# Patient Record
Sex: Female | Born: 1945 | Race: White | Hispanic: No | Marital: Married | Smoking: Former smoker
Health system: Southern US, Community
[De-identification: ages and names within clinical notes are randomized; demographics above are authoritative.]

## PROBLEM LIST (undated history)

## (undated) DIAGNOSIS — K649 Unspecified hemorrhoids: Secondary | ICD-10-CM

## (undated) DIAGNOSIS — J302 Other seasonal allergic rhinitis: Secondary | ICD-10-CM

## (undated) DIAGNOSIS — K219 Gastro-esophageal reflux disease without esophagitis: Secondary | ICD-10-CM

## (undated) DIAGNOSIS — R42 Dizziness and giddiness: Secondary | ICD-10-CM

## (undated) DIAGNOSIS — L309 Dermatitis, unspecified: Secondary | ICD-10-CM

## (undated) DIAGNOSIS — J189 Pneumonia, unspecified organism: Secondary | ICD-10-CM

## (undated) DIAGNOSIS — M1990 Unspecified osteoarthritis, unspecified site: Secondary | ICD-10-CM

## (undated) DIAGNOSIS — I10 Essential (primary) hypertension: Secondary | ICD-10-CM

## (undated) DIAGNOSIS — Z8673 Personal history of transient ischemic attack (TIA), and cerebral infarction without residual deficits: Secondary | ICD-10-CM

## (undated) DIAGNOSIS — R21 Rash and other nonspecific skin eruption: Secondary | ICD-10-CM

## (undated) HISTORY — PX: HEMORROIDECTOMY: SUR656

## (undated) HISTORY — PX: ABDOMINAL HYSTERECTOMY: SHX81

## (undated) HISTORY — PX: TUBAL LIGATION: SHX77

---

## 2004-02-01 ENCOUNTER — Ambulatory Visit: Payer: Self-pay | Admitting: Family Medicine

## 2004-07-14 ENCOUNTER — Ambulatory Visit: Payer: Self-pay | Admitting: Family Medicine

## 2004-09-12 ENCOUNTER — Ambulatory Visit: Payer: Self-pay | Admitting: Family Medicine

## 2004-12-12 ENCOUNTER — Ambulatory Visit: Payer: Self-pay | Admitting: Family Medicine

## 2005-01-09 ENCOUNTER — Ambulatory Visit: Payer: Self-pay | Admitting: Family Medicine

## 2005-04-18 ENCOUNTER — Ambulatory Visit: Payer: Self-pay | Admitting: Family Medicine

## 2005-08-21 ENCOUNTER — Ambulatory Visit: Payer: Self-pay | Admitting: Family Medicine

## 2005-12-26 ENCOUNTER — Ambulatory Visit: Payer: Self-pay | Admitting: Family Medicine

## 2006-02-08 ENCOUNTER — Ambulatory Visit: Payer: Self-pay | Admitting: Family Medicine

## 2006-07-04 ENCOUNTER — Ambulatory Visit: Payer: Self-pay | Admitting: Family Medicine

## 2011-09-15 ENCOUNTER — Encounter (HOSPITAL_COMMUNITY): Payer: Self-pay | Admitting: Pharmacy Technician

## 2011-09-20 ENCOUNTER — Ambulatory Visit (HOSPITAL_COMMUNITY)
Admission: RE | Admit: 2011-09-20 | Discharge: 2011-09-20 | Disposition: A | Discharge status: Home / Self Care | Payer: Medicare Other | Source: Ambulatory Visit | Attending: Orthopedic Surgery | Admitting: Orthopedic Surgery

## 2011-09-20 ENCOUNTER — Encounter (HOSPITAL_COMMUNITY): Payer: Self-pay

## 2011-09-20 ENCOUNTER — Encounter (HOSPITAL_COMMUNITY)
Admission: RE | Admit: 2011-09-20 | Discharge: 2011-09-20 | Disposition: A | Discharge status: Home / Self Care | Payer: Medicare Other | Source: Ambulatory Visit | Attending: Orthopedic Surgery | Admitting: Orthopedic Surgery

## 2011-09-20 DIAGNOSIS — Z01810 Encounter for preprocedural cardiovascular examination: Secondary | ICD-10-CM | POA: Insufficient documentation

## 2011-09-20 DIAGNOSIS — R9431 Abnormal electrocardiogram [ECG] [EKG]: Secondary | ICD-10-CM | POA: Insufficient documentation

## 2011-09-20 DIAGNOSIS — Z01812 Encounter for preprocedural laboratory examination: Secondary | ICD-10-CM | POA: Insufficient documentation

## 2011-09-20 DIAGNOSIS — Z01818 Encounter for other preprocedural examination: Secondary | ICD-10-CM | POA: Insufficient documentation

## 2011-09-20 DIAGNOSIS — Z87891 Personal history of nicotine dependence: Secondary | ICD-10-CM | POA: Insufficient documentation

## 2011-09-20 HISTORY — DX: Gastro-esophageal reflux disease without esophagitis: K21.9

## 2011-09-20 HISTORY — DX: Essential (primary) hypertension: I10

## 2011-09-20 HISTORY — DX: Other seasonal allergic rhinitis: J30.2

## 2011-09-20 HISTORY — DX: Unspecified osteoarthritis, unspecified site: M19.90

## 2011-09-20 LAB — URINALYSIS, ROUTINE W REFLEX MICROSCOPIC
Ketones, ur: NEGATIVE mg/dL
Leukocytes, UA: NEGATIVE
Nitrite: NEGATIVE
pH: 6 (ref 5.0–8.0)

## 2011-09-20 LAB — PROTIME-INR
INR: 0.94 (ref 0.00–1.49)
Prothrombin Time: 12.8 seconds (ref 11.6–15.2)

## 2011-09-20 LAB — CBC
MCH: 30.6 pg (ref 26.0–34.0)
MCHC: 33 g/dL (ref 30.0–36.0)
Platelets: 322 10*3/uL (ref 150–400)
RDW: 14.2 % (ref 11.5–15.5)

## 2011-09-20 LAB — BASIC METABOLIC PANEL
CO2: 28 mEq/L (ref 19–32)
Chloride: 95 mEq/L — ABNORMAL LOW (ref 96–112)
GFR calc Af Amer: 74 mL/min — ABNORMAL LOW (ref >90)
Sodium: 132 mEq/L — ABNORMAL LOW (ref 135–145)

## 2011-09-20 LAB — APTT: aPTT: 29 seconds (ref 24–37)

## 2011-09-20 NOTE — Patient Instructions (Signed)
Kristy Watson  09/20/2011   Your procedure is scheduled on:  09-20-2011  Report to [facility] at  0630 AM.  Call this number if you have problems the morning of surgery: [phone]   Remember:   Do not eat food or drink liquids:After Midnight.  .  Take these medicines the morning of surgery with A SIP OF WATER: albuterol inhaler if needed, flonase nasal spray if needed, advair if needed, ativan if needed   Do not wear jewelry or make up.  Do not wear lotions, powders, or perfumes.Do not wear deodorant.    Do not bring valuables to the hospital.  Contacts, dentures or bridgework may not be worn into surgery.  Leave suitcase in the car. After surgery it may be brought to your room.  For patients admitted to the hospital, checkout time is 11:00 AM the day    discharge                             Special Instructions: CHG Shower Use Special Wash: 1/2 bottle night before surgery and 1/2 bottle morning of surgery, use regular soap on face and front and back private area. No shaving for 2 days before surgery.   Please read over the following fact sheets that you were given: MRSA Information, blood fact sheet, incentive spirometer fact sheet  [facility] pre op nurse phone number [phone], call if needed

## 2011-09-21 NOTE — Pre-Procedure Instructions (Signed)
Spoke with dr fortune made aware 08-20-2011 ekg results, pt ok for surgery

## 2011-09-25 NOTE — H&P (Signed)
Kristy Watson is an 66 y.o. female.    Chief Complaint:   Right hip OA and pain   HPI: Pt is a 66 y.o. female complaining of right hip pain for 1+ years. Pain had continually increased since the beginning, and has been effecting her life more and more. X-rays in the clinic show end-stage arthritic changes of the right hip. Pt has tried various conservative treatments which have failed to alleviate their symptoms, NSAIDs. Various options are discussed with the patient. Risks, benefits and expectations were discussed with the patient. Patient understand the risks, benefits and expectations and wishes to proceed with surgery. Dr. Lysbeth Watson has cleared the patient for surgery.  PCP:  Kristy Hector, MD  D/C Plans:  Home with HHPT  Post-op Meds:  Rx given for ASA, Robaxin, Iron, Colace and MiraLax  Tranexamic Acid:   To be given  Decadron:   To be given  PMH: Past Medical History  Diagnosis Date  . Hypertension   . Seasonal allergies   . GERD (gastroesophageal reflux disease)   . Arthritis     PSH: Past Surgical History  Procedure Date  . Abdominal hysterectomy 34 yrs ago  . Hemorroidectomy yrs ago  . Tubal ligation yrs ago    Social History:  reports that she quit smoking about 12 years ago. Her smoking use included Cigarettes. She has a 5 pack-year smoking history. She has never used smokeless tobacco. She reports that she drinks about 1.8 ounces of alcohol per week. She reports that she does not use illicit drugs.  Allergies:  No Known Allergies  Medications: No current facility-administered medications for this encounter.   Current Outpatient Prescriptions  Medication Sig Dispense Refill  . acetaminophen (TYLENOL) 500 MG tablet Take 1,000 mg by mouth every 6 (six) hours as needed.      Marland Kitchen albuterol (PROVENTIL HFA;VENTOLIN HFA) 108 (90 BASE) MCG/ACT inhaler Inhale 2 puffs into the lungs every 6 (six) hours as needed. Wheezing      . amLODipine-benazepril (LOTREL) 5-20  MG per capsule Take 1 capsule by mouth daily before breakfast.      . cholecalciferol (VITAMIN D) 1000 UNITS tablet Take 1,000 Units by mouth daily.      . cyclobenzaprine (FLEXERIL) 5 MG tablet Take 5-10 mg by mouth 2 (two) times daily as needed. Muscle spasm      . esomeprazole (NEXIUM) 20 MG capsule Take 20 mg by mouth every evening.      . estrogens, conjugated, (PREMARIN) 0.625 MG tablet Take 0.625 mg by mouth at bedtime. Take daily for 21 days then do not take for 7 days.      . fish oil-omega-3 fatty acids 1000 MG capsule Take 1 g by mouth 2 (two) times daily. 2 in am, 1 in pm      . fluticasone (FLONASE) 50 MCG/ACT nasal spray Place 2 sprays into the nose daily as needed. Allergies      . Fluticasone-Salmeterol (ADVAIR) 100-50 MCG/DOSE AEPB Inhale 1 puff into the lungs 2 (two) times daily as needed. Wheezing      . LORazepam (ATIVAN) 0.5 MG tablet Take 0.5 mg by mouth every 8 (eight) hours as needed. Anxiety      . Naproxen-Esomeprazole (VIMOVO) 500-20 MG TBEC Take 1 tablet by mouth 2 (two) times daily.        ROS: Review of Systems  Constitutional: Negative.   HENT: Positive for tinnitus.   Eyes: Negative.   Respiratory: Negative.   Cardiovascular: Negative.  Gastrointestinal: Positive for heartburn.  Genitourinary: Negative.   Musculoskeletal: Positive for back pain and joint pain.  Skin: Negative.   Neurological: Negative.   Endo/Heme/Allergies: Negative.   Psychiatric/Behavioral: Negative.      Physical Exam: Physical Exam  Constitutional: She is oriented to person, place, and time and well-developed, well-nourished, and in no distress.  HENT:  Head: Normocephalic and atraumatic.  Nose: Nose normal.  Mouth/Throat: Oropharynx is clear and moist.  Eyes: Pupils are equal, round, and reactive to light.  Neck: Neck supple. No JVD present. No tracheal deviation present. No thyromegaly present.  Cardiovascular: Normal rate, regular rhythm and intact distal pulses.     Pulmonary/Chest: Effort normal and breath sounds normal. No respiratory distress. She has no wheezes. She has no rales. She exhibits no tenderness.  Abdominal: Soft. There is no tenderness. There is no guarding.  Musculoskeletal:       Right hip: She exhibits decreased range of motion, decreased strength, tenderness, bony tenderness and crepitus. She exhibits no swelling, no deformity and no laceration.  Lymphadenopathy:    She has no cervical adenopathy.  Neurological: She is alert and oriented to person, place, and time.  Skin: Skin is warm and dry.  Psychiatric: Affect normal.     Assessment/Plan Assessment:  Right hip OA and pain   Plan: Patient will undergo a right total hip arthroplasty, anterior approach on 09/26/2011 per Dr. Charlann Watson at [facility]. Risks benefits and expectation were discussed with the patient. Patient understand risks, benefits and expectation and wishes to proceed.   [name]   PAC  09/25/2011, 8:16 PM

## 2011-09-26 ENCOUNTER — Encounter (HOSPITAL_COMMUNITY): Payer: Self-pay | Admitting: *Deleted

## 2011-09-26 ENCOUNTER — Inpatient Hospital Stay (HOSPITAL_COMMUNITY): Payer: Medicare Other | Admitting: Anesthesiology

## 2011-09-26 ENCOUNTER — Inpatient Hospital Stay (HOSPITAL_COMMUNITY)
Admission: RE | Admit: 2011-09-26 | Discharge: 2011-09-28 | DRG: 470 | Disposition: A | Discharge status: Home Health | Payer: Medicare Other | Source: Ambulatory Visit | Attending: Orthopedic Surgery | Admitting: Orthopedic Surgery

## 2011-09-26 ENCOUNTER — Encounter (HOSPITAL_COMMUNITY): Payer: Self-pay | Admitting: Anesthesiology

## 2011-09-26 ENCOUNTER — Inpatient Hospital Stay (HOSPITAL_COMMUNITY): Payer: Medicare Other

## 2011-09-26 ENCOUNTER — Encounter (HOSPITAL_COMMUNITY)
Admission: RE | Disposition: A | Discharge status: Home Health | Payer: Self-pay | Source: Ambulatory Visit | Attending: Orthopedic Surgery

## 2011-09-26 DIAGNOSIS — Z79899 Other long term (current) drug therapy: Secondary | ICD-10-CM

## 2011-09-26 DIAGNOSIS — Z96649 Presence of unspecified artificial hip joint: Secondary | ICD-10-CM

## 2011-09-26 DIAGNOSIS — I10 Essential (primary) hypertension: Secondary | ICD-10-CM | POA: Diagnosis present

## 2011-09-26 DIAGNOSIS — D62 Acute posthemorrhagic anemia: Secondary | ICD-10-CM | POA: Diagnosis not present

## 2011-09-26 DIAGNOSIS — M1610 Unilateral primary osteoarthritis, unspecified hip: Principal | ICD-10-CM | POA: Diagnosis present

## 2011-09-26 DIAGNOSIS — Z87891 Personal history of nicotine dependence: Secondary | ICD-10-CM

## 2011-09-26 DIAGNOSIS — M169 Osteoarthritis of hip, unspecified: Principal | ICD-10-CM | POA: Diagnosis present

## 2011-09-26 DIAGNOSIS — K219 Gastro-esophageal reflux disease without esophagitis: Secondary | ICD-10-CM | POA: Diagnosis present

## 2011-09-26 DIAGNOSIS — IMO0002 Reserved for concepts with insufficient information to code with codable children: Secondary | ICD-10-CM | POA: Diagnosis present

## 2011-09-26 HISTORY — PX: TOTAL HIP ARTHROPLASTY: SHX124

## 2011-09-26 LAB — TYPE AND SCREEN: ABO/RH(D): A POS

## 2011-09-26 SURGERY — ARTHROPLASTY, HIP, TOTAL, ANTERIOR APPROACH
Anesthesia: General | Site: Hip | Laterality: Right | Wound class: Clean

## 2011-09-26 MED ORDER — METOCLOPRAMIDE HCL 10 MG PO TABS: 5.0000 mg | ORAL_TABLET | Freq: Three times a day (TID) | ORAL | Status: DC | PRN

## 2011-09-26 MED ORDER — POLYETHYLENE GLYCOL 3350 17 G PO PACK
17.0000 g | PACK | Freq: Two times a day (BID) | ORAL | Status: DC
  Administered 2011-09-26 – 2011-09-27 (×2): 17 g via ORAL

## 2011-09-26 MED ORDER — FLUTICASONE-SALMETEROL 100-50 MCG/DOSE IN AEPB: 1.0000 | INHALATION_SPRAY | Freq: Two times a day (BID) | RESPIRATORY_TRACT | Status: DC | PRN

## 2011-09-26 MED ORDER — FERROUS SULFATE 325 (65 FE) MG PO TABS
325.0000 mg | ORAL_TABLET | Freq: Three times a day (TID) | ORAL | Status: DC
  Administered 2011-09-26 – 2011-09-28 (×3): 325 mg via ORAL
  Filled 2011-09-26 (×8): qty 1

## 2011-09-26 MED ORDER — PANTOPRAZOLE SODIUM 40 MG PO TBEC
40.0000 mg | DELAYED_RELEASE_TABLET | Freq: Every day | ORAL | Status: DC
  Administered 2011-09-26 – 2011-09-28 (×3): 40 mg via ORAL
  Filled 2011-09-26 (×3): qty 1

## 2011-09-26 MED ORDER — CEFAZOLIN SODIUM-DEXTROSE 2-3 GM-% IV SOLR
2.0000 g | INTRAVENOUS | Status: AC
  Administered 2011-09-26: 2 g via INTRAVENOUS

## 2011-09-26 MED ORDER — ALBUTEROL SULFATE HFA 108 (90 BASE) MCG/ACT IN AERS: 2.0000 | INHALATION_SPRAY | Freq: Four times a day (QID) | RESPIRATORY_TRACT | Status: DC | PRN

## 2011-09-26 MED ORDER — SODIUM CHLORIDE 0.9 % IV SOLN
100.0000 mL/h | INTRAVENOUS | Status: DC
  Administered 2011-09-26: 100 mL/h via INTRAVENOUS
  Filled 2011-09-26 (×12): qty 1000

## 2011-09-26 MED ORDER — BISACODYL 10 MG RE SUPP: 10.0000 mg | Freq: Every day | RECTAL | Status: DC | PRN

## 2011-09-26 MED ORDER — DEXAMETHASONE SODIUM PHOSPHATE 10 MG/ML IJ SOLN
INTRAMUSCULAR | Status: DC | PRN
  Administered 2011-09-26: 10 mg via INTRAVENOUS

## 2011-09-26 MED ORDER — NEOSTIGMINE METHYLSULFATE 1 MG/ML IJ SOLN
INTRAMUSCULAR | Status: DC | PRN
  Administered 2011-09-26: 4 mg via INTRAVENOUS

## 2011-09-26 MED ORDER — ROCURONIUM BROMIDE 100 MG/10ML IV SOLN
INTRAVENOUS | Status: DC | PRN
  Administered 2011-09-26: 45 mg via INTRAVENOUS
  Administered 2011-09-26: 15 mg via INTRAVENOUS

## 2011-09-26 MED ORDER — ONDANSETRON HCL 4 MG PO TABS: 4.0000 mg | ORAL_TABLET | Freq: Four times a day (QID) | ORAL | Status: DC | PRN

## 2011-09-26 MED ORDER — HYDROCODONE-ACETAMINOPHEN 7.5-325 MG PO TABS
1.0000 | ORAL_TABLET | ORAL | Status: DC
  Administered 2011-09-26 (×4): 1 via ORAL
  Administered 2011-09-27: 2 via ORAL
  Filled 2011-09-26: qty 2
  Filled 2011-09-26 (×4): qty 1

## 2011-09-26 MED ORDER — GLYCOPYRROLATE 0.2 MG/ML IJ SOLN
INTRAMUSCULAR | Status: DC | PRN
  Administered 2011-09-26: .5 mg via INTRAVENOUS

## 2011-09-26 MED ORDER — DOCUSATE SODIUM 100 MG PO CAPS
100.0000 mg | ORAL_CAPSULE | Freq: Two times a day (BID) | ORAL | Status: DC
  Administered 2011-09-26 – 2011-09-28 (×4): 100 mg via ORAL

## 2011-09-26 MED ORDER — AMLODIPINE BESY-BENAZEPRIL HCL 5-20 MG PO CAPS: 1.0000 | ORAL_CAPSULE | Freq: Every day | ORAL | Status: DC

## 2011-09-26 MED ORDER — RIVAROXABAN 10 MG PO TABS
10.0000 mg | ORAL_TABLET | ORAL | Status: DC
  Administered 2011-09-27 – 2011-09-28 (×2): 10 mg via ORAL
  Filled 2011-09-26 (×3): qty 1

## 2011-09-26 MED ORDER — BENAZEPRIL HCL 20 MG PO TABS
20.0000 mg | ORAL_TABLET | Freq: Every day | ORAL | Status: DC
  Administered 2011-09-28: 20 mg via ORAL
  Filled 2011-09-26 (×3): qty 1

## 2011-09-26 MED ORDER — CELECOXIB 200 MG PO CAPS
200.0000 mg | ORAL_CAPSULE | Freq: Two times a day (BID) | ORAL | Status: DC
  Administered 2011-09-26 – 2011-09-28 (×5): 200 mg via ORAL
  Filled 2011-09-26 (×6): qty 1

## 2011-09-26 MED ORDER — MENTHOL 3 MG MT LOZG
1.0000 | LOZENGE | OROMUCOSAL | Status: DC | PRN
  Filled 2011-09-26: qty 9

## 2011-09-26 MED ORDER — AMLODIPINE BESYLATE 5 MG PO TABS
5.0000 mg | ORAL_TABLET | Freq: Every day | ORAL | Status: DC
  Administered 2011-09-28: 5 mg via ORAL
  Filled 2011-09-26 (×3): qty 1

## 2011-09-26 MED ORDER — HYDROMORPHONE HCL PF 1 MG/ML IJ SOLN
0.2500 mg | INTRAMUSCULAR | Status: DC | PRN
  Administered 2011-09-26 (×3): 0.5 mg via INTRAVENOUS

## 2011-09-26 MED ORDER — ONDANSETRON HCL 4 MG/2ML IJ SOLN
4.0000 mg | Freq: Four times a day (QID) | INTRAMUSCULAR | Status: DC | PRN
  Administered 2011-09-27: 4 mg via INTRAVENOUS
  Filled 2011-09-26: qty 2

## 2011-09-26 MED ORDER — CEFAZOLIN SODIUM-DEXTROSE 2-3 GM-% IV SOLR
2.0000 g | Freq: Four times a day (QID) | INTRAVENOUS | Status: AC
  Administered 2011-09-26 (×2): 2 g via INTRAVENOUS
  Filled 2011-09-26 (×2): qty 50

## 2011-09-26 MED ORDER — DEXTROSE 5 % IV SOLN
500.0000 mg | Freq: Four times a day (QID) | INTRAVENOUS | Status: DC | PRN
  Administered 2011-09-26: 500 mg via INTRAVENOUS
  Filled 2011-09-26 (×2): qty 5

## 2011-09-26 MED ORDER — LACTATED RINGERS IV SOLN
INTRAVENOUS | Status: DC
  Administered 2011-09-26: 11:00:00 via INTRAVENOUS

## 2011-09-26 MED ORDER — FENTANYL CITRATE 0.05 MG/ML IJ SOLN
INTRAMUSCULAR | Status: DC | PRN
  Administered 2011-09-26: 50 ug via INTRAVENOUS
  Administered 2011-09-26: 100 ug via INTRAVENOUS

## 2011-09-26 MED ORDER — MIDAZOLAM HCL 5 MG/5ML IJ SOLN
INTRAMUSCULAR | Status: DC | PRN
  Administered 2011-09-26: 2 mg via INTRAVENOUS

## 2011-09-26 MED ORDER — FLEET ENEMA 7-19 GM/118ML RE ENEM: 1.0000 | ENEMA | Freq: Once | RECTAL | Status: AC | PRN

## 2011-09-26 MED ORDER — FLUTICASONE PROPIONATE 50 MCG/ACT NA SUSP
2.0000 | Freq: Every day | NASAL | Status: DC | PRN
Start: 2011-09-26 — End: 2011-09-28
  Filled 2011-09-26: qty 16

## 2011-09-26 MED ORDER — DEXAMETHASONE SODIUM PHOSPHATE 10 MG/ML IJ SOLN
10.0000 mg | Freq: Once | INTRAMUSCULAR | Status: AC
  Administered 2011-09-27: 10 mg via INTRAVENOUS
  Filled 2011-09-26: qty 1

## 2011-09-26 MED ORDER — METOCLOPRAMIDE HCL 5 MG/ML IJ SOLN
5.0000 mg | Freq: Three times a day (TID) | INTRAMUSCULAR | Status: DC | PRN
  Administered 2011-09-27: 10 mg via INTRAVENOUS
  Filled 2011-09-26: qty 2

## 2011-09-26 MED ORDER — LORAZEPAM 0.5 MG PO TABS: 0.5000 mg | ORAL_TABLET | Freq: Three times a day (TID) | ORAL | Status: DC | PRN

## 2011-09-26 MED ORDER — ACETAMINOPHEN 10 MG/ML IV SOLN
INTRAVENOUS | Status: DC | PRN
  Administered 2011-09-26: 1000 mg via INTRAVENOUS

## 2011-09-26 MED ORDER — ESTROGENS CONJUGATED 0.625 MG PO TABS
0.6250 mg | ORAL_TABLET | Freq: Every day | ORAL | Status: DC
  Administered 2011-09-26 – 2011-09-27 (×2): 0.625 mg via ORAL
  Filled 2011-09-26 (×3): qty 1

## 2011-09-26 MED ORDER — LACTATED RINGERS IV SOLN
INTRAVENOUS | Status: DC | PRN
  Administered 2011-09-26 (×3): via INTRAVENOUS

## 2011-09-26 MED ORDER — PHENOL 1.4 % MT LIQD
1.0000 | OROMUCOSAL | Status: DC | PRN
  Filled 2011-09-26: qty 177

## 2011-09-26 MED ORDER — ZOLPIDEM TARTRATE 5 MG PO TABS: 5.0000 mg | ORAL_TABLET | Freq: Every evening | ORAL | Status: DC | PRN

## 2011-09-26 MED ORDER — METHOCARBAMOL 500 MG PO TABS
500.0000 mg | ORAL_TABLET | Freq: Four times a day (QID) | ORAL | Status: DC | PRN
  Administered 2011-09-26 – 2011-09-28 (×3): 500 mg via ORAL
  Filled 2011-09-26 (×3): qty 1

## 2011-09-26 MED ORDER — ALUM & MAG HYDROXIDE-SIMETH 200-200-20 MG/5ML PO SUSP: 30.0000 mL | ORAL | Status: DC | PRN

## 2011-09-26 MED ORDER — AMLODIPINE BESYLATE 5 MG PO TABS
5.0000 mg | ORAL_TABLET | Freq: Once | ORAL | Status: AC
  Administered 2011-09-26: 5 mg via ORAL
  Filled 2011-09-26: qty 1

## 2011-09-26 MED ORDER — PROPOFOL 10 MG/ML IV EMUL
INTRAVENOUS | Status: DC | PRN
  Administered 2011-09-26: 140 mg via INTRAVENOUS

## 2011-09-26 MED ORDER — EPHEDRINE SULFATE 50 MG/ML IJ SOLN
INTRAMUSCULAR | Status: DC | PRN
  Administered 2011-09-26: 5 mg via INTRAVENOUS
  Administered 2011-09-26: 10 mg via INTRAVENOUS

## 2011-09-26 MED ORDER — HYDROMORPHONE HCL PF 1 MG/ML IJ SOLN
0.5000 mg | INTRAMUSCULAR | Status: DC | PRN
Start: 2011-09-26 — End: 2011-09-28

## 2011-09-26 MED ORDER — ONDANSETRON HCL 4 MG/2ML IJ SOLN
INTRAMUSCULAR | Status: DC | PRN
  Administered 2011-09-26: 4 mg via INTRAVENOUS

## 2011-09-26 MED ORDER — PROMETHAZINE HCL 25 MG/ML IJ SOLN: 6.2500 mg | INTRAMUSCULAR | Status: DC | PRN

## 2011-09-26 MED ORDER — DIPHENHYDRAMINE HCL 25 MG PO CAPS: 25.0000 mg | ORAL_CAPSULE | Freq: Four times a day (QID) | ORAL | Status: DC | PRN

## 2011-09-26 MED ORDER — 0.9 % SODIUM CHLORIDE (POUR BTL) OPTIME
TOPICAL | Status: DC | PRN
  Administered 2011-09-26: 1000 mL

## 2011-09-26 SURGICAL SUPPLY — 39 items
ADH SKN CLS APL DERMABOND .7 (GAUZE/BANDAGES/DRESSINGS) ×1
BAG SPEC THK2 15X12 ZIP CLS (MISCELLANEOUS) ×2
BAG ZIPLOCK 12X15 (MISCELLANEOUS) ×4 IMPLANT
BLADE SAW SGTL 18X1.27X75 (BLADE) ×2 IMPLANT
CLOTH BEACON ORANGE TIMEOUT ST (SAFETY) ×2 IMPLANT
DERMABOND ADVANCED (GAUZE/BANDAGES/DRESSINGS) ×1
DERMABOND ADVANCED .7 DNX12 (GAUZE/BANDAGES/DRESSINGS) ×1 IMPLANT
DRAPE C-ARM 42X72 X-RAY (DRAPES) ×2 IMPLANT
DRAPE STERI IOBAN 125X83 (DRAPES) ×2 IMPLANT
DRAPE U-SHAPE 47X51 STRL (DRAPES) ×6 IMPLANT
DRSG AQUACEL AG ADV 3.5X10 (GAUZE/BANDAGES/DRESSINGS) ×2 IMPLANT
DRSG TEGADERM 2-3/8X2-3/4 SM (GAUZE/BANDAGES/DRESSINGS) ×1 IMPLANT
DRSG TEGADERM 4X4.75 (GAUZE/BANDAGES/DRESSINGS) IMPLANT
DURAPREP 26ML APPLICATOR (WOUND CARE) ×2 IMPLANT
ELECT BLADE TIP CTD 4 INCH (ELECTRODE) ×2 IMPLANT
ELECT REM PT RETURN 9FT ADLT (ELECTROSURGICAL) ×2
ELECTRODE REM PT RTRN 9FT ADLT (ELECTROSURGICAL) ×1 IMPLANT
EVACUATOR 1/8 PVC DRAIN (DRAIN) IMPLANT
FACESHIELD LNG OPTICON STERILE (SAFETY) ×8 IMPLANT
GAUZE SPONGE 2X2 8PLY STRL LF (GAUZE/BANDAGES/DRESSINGS) ×1 IMPLANT
GLOVE BIOGEL PI IND STRL 7.5 (GLOVE) ×1 IMPLANT
GLOVE BIOGEL PI IND STRL 8 (GLOVE) ×1 IMPLANT
GLOVE BIOGEL PI INDICATOR 7.5 (GLOVE) ×1
GLOVE BIOGEL PI INDICATOR 8 (GLOVE) ×1
GLOVE ECLIPSE 8.0 STRL XLNG CF (GLOVE) ×2 IMPLANT
GLOVE ORTHO TXT STRL SZ7.5 (GLOVE) ×4 IMPLANT
GOWN BRE IMP PREV XXLGXLNG (GOWN DISPOSABLE) ×4 IMPLANT
GOWN STRL NON-REIN LRG LVL3 (GOWN DISPOSABLE) ×2 IMPLANT
KIT BASIN OR (CUSTOM PROCEDURE TRAY) ×2 IMPLANT
PACK TOTAL JOINT (CUSTOM PROCEDURE TRAY) ×2 IMPLANT
PADDING CAST COTTON 6X4 STRL (CAST SUPPLIES) ×2 IMPLANT
SPONGE GAUZE 2X2 STER 10/PKG (GAUZE/BANDAGES/DRESSINGS) ×1
SUCTION FRAZIER 12FR DISP (SUCTIONS) ×2 IMPLANT
SUT MNCRL AB 4-0 PS2 18 (SUTURE) ×2 IMPLANT
SUT VIC AB 1 CT1 36 (SUTURE) ×7 IMPLANT
SUT VIC AB 2-0 CT1 27 (SUTURE) ×4
SUT VIC AB 2-0 CT1 TAPERPNT 27 (SUTURE) ×2 IMPLANT
TOWEL OR 17X26 10 PK STRL BLUE (TOWEL DISPOSABLE) ×4 IMPLANT
TRAY FOLEY CATH 14FRSI W/METER (CATHETERS) ×2 IMPLANT

## 2011-09-26 NOTE — Plan of Care (Signed)
Problem: Consults Goal: Diagnosis- Total Joint Replacement Right anterior hip     

## 2011-09-26 NOTE — Anesthesia Postprocedure Evaluation (Signed)
  Anesthesia Post-op Note  Patient: Kristy Watson  Procedure(s) Performed: Procedure(s) (LRB): TOTAL HIP ARTHROPLASTY ANTERIOR APPROACH (Right)  Patient Location: PACU  Anesthesia Type: General  Level of Consciousness: awake and alert   Airway and Oxygen Therapy: Patient Spontanous Breathing  Post-op Pain: mild  Post-op Assessment: Post-op Vital signs reviewed, Patient's Cardiovascular Status Stable, Respiratory Function Stable, Patent Airway and No signs of Nausea or vomiting  Post-op Vital Signs: stable  Complications: No apparent anesthesia complications

## 2011-09-26 NOTE — Op Note (Signed)
NAME:  Kristy Watson                ACCOUNT NO.: [id number]      MEDICAL RECORD NO.: [id number]      FACILITY:  [facility]      PHYSICIAN:  Durene Romans D  DATE OF BIRTH:  [birth year]     DATE OF PROCEDURE:  09/26/2011                                 OPERATIVE REPORT         PREOPERATIVE DIAGNOSIS: Right  hip osteoarthritis.      POSTOPERATIVE DIAGNOSIS:  Right hip osteoarthritis.      PROCEDURE:  Right total hip replacement through an anterior approach   utilizing DePuy THR system, component size 52mm pinnacle cup, a size 36+4 neutral   Altrex liner, a size 4 standard Tri Lock stem with a 36-2 metal ball.      SURGEON:  Madlyn Frankel. Charlann Boxer, M.D.      ASSISTANT:  Lanney Gins, PA      ANESTHESIA:  General.      SPECIMENS:  None.      COMPLICATIONS:  None.      BLOOD LOSS:  300 cc     DRAINS:  One Hemovac.      INDICATION OF THE PROCEDURE:  Kristy Watson is a 66 y.o. female who had   presented to office for evaluation of right hip pain.  Radiographs revealed   progressive degenerative changes with bone-on-bone   articulation to the  hip joint.  The patient had painful limited range of   motion significantly affecting their overall quality of life.  The patient was failing to    respond to conservative measures, and at this point was ready   to proceed with more definitive measures.  The patient has noted progressive   degenerative changes in his hip, progressive problems and dysfunction   with regarding the hip prior to surgery.  Consent was obtained for   benefit of pain relief.  Specific risk of infection, DVT, component   failure, dislocation, need for revision surgery, as well discussion of   the anterior versus posterior approach were reviewed.  Consent was   obtained for benefit of anterior pain relief through an anterior   approach.      PROCEDURE IN DETAIL:  The patient was brought to operative theater.   Once adequate anesthesia, preoperative  antibiotics, 2gm Ancef administered.   The patient was positioned supine on the OSI Hanna table.  Once adequate   padding of boney process was carried out, we had predraped out the hip, and  used fluoroscopy to confirm orientation of the pelvis and position.      The right hip was then prepped and draped from proximal iliac crest to   mid thigh with shower curtain technique.      Time-out was performed identifying the patient, planned procedure, and   extremity.     An incision was then made 2 cm distal and lateral to the   anterior superior iliac spine extending over the orientation of the   tensor fascia lata muscle and sharp dissection was carried down to the   fascia of the muscle and protractor placed in the soft tissues.      The fascia was then incised.  The muscle belly was identified and swept   laterally and  retractor placed along the superior neck.  Following   cauterization of the circumflex vessels and removing some pericapsular   fat, a second cobra retractor was placed on the inferior neck.  A third   retractor was placed on the anterior acetabulum after elevating the   anterior rectus.  A L-capsulotomy was along the line of the   superior neck to the trochanteric fossa, then extended proximally and   distally.  Tag sutures were placed and the retractors were then placed   intracapsular.  We then identified the trochanteric fossa and   orientation of my neck cut, confirmed this radiographically   and then made a neck osteotomy with the femur on traction.  The femoral   head was removed without difficulty or complication.  Traction was let   off and retractors were placed posterior and anterior around the   acetabulum.      The labrum and foveal tissue were debrided.  I began reaming with a 45mm   reamer and reamed up to 51mm reamer with good bony bed preparation and a 52   cup was chosen.  The final 52mm Pinnacle cup was then impacted under fluoroscopy  to confirm the  depth of penetration and orientation with respect to   abduction.  A screw was placed followed by the hole eliminator.  The final   36+4 neutral Altrex liner was impacted with good visualized rim fit.  The cup was positioned anatomically within the acetabular portion of the pelvis.      At this point, the femur was rolled at 80 degrees.  Further capsule was   released off the inferior aspect of the femoral neck.  I then   released the superior capsule proximally.  The hook was placed laterally   along the femur and elevated manually and held in position with the bed   hook.  The leg was then extended and adducted with the leg rolled to 100   degrees of external rotation.  Once the proximal femur was fully   exposed, I used a box osteotome to set orientation.  I then began   broaching with the starting chili pepper broach and passed this by hand and then broached up to 4.  With the 4 broach in place I chose a standard neck and did a trial reduction.  The offset was appropriate, leg lengths   appeared to be equal, confirmed radiographically.   Given these findings, I went ahead and dislocated the hip, repositioned all   retractors and positioned the right hip in the extended and abducted position.  The final 4 standard Tri Lock stem was   chosen and it was impacted down to the level of neck cut.  Based on this   and the trial reduction, a 36-2 metal ball was chosen and   impacted onto a clean and dry trunnion, and the hip was reduced.  The   hip had been irrigated throughout the case again at this point.  I did   reapproximate the superior capsular leaflet to the anterior leaflet   using #1 Vicryl, placed a medium Hemovac drain deep.  The fascia of the   tensor fascia lata muscle was then reapproximated using #1 Vicryl.  The   remaining wound was closed with 2-0 Vicryl and running 4-0 Monocryl.   The hip was cleaned, dried, and dressed sterilely using Dermabond and   Aquacel dressing.  Drain  site dressed separately.  She was then brought  to recovery room in stable condition tolerating the procedure well.    Lanney Gins, PA-C was present for the entirety of the case involved from   preoperative positioning, perioperative retractor management, general   facilitation of the case, as well as primary wound closure as assistant.            Madlyn Frankel Charlann Boxer, M.D.            MDO/MEDQ  D:  12/06/2010  T:  12/06/2010  Job:  [id number]      Electronically Signed by Durene Romans M.D. on 12/12/2010 09:15:38 AM

## 2011-09-26 NOTE — Interval H&P Note (Signed)
History and Physical Interval Note:  09/26/2011 7:16 AM  Kristy Watson  has presented today for surgery, with the diagnosis of Osteoarthritis of the Right Hip  The various methods of treatment have been discussed with the patient and family. After consideration of risks, benefits and other options for treatment, the patient has consented to  Procedure(s) (LRB): RIGHT TOTAL HIP ARTHROPLASTY ANTERIOR APPROACH (Right) as a surgical intervention .  The patient's history has been reviewed, patient examined, no change in status, stable for surgery.  I have reviewed the patient's chart and labs.  Questions were answered to the patient's satisfaction.     Shelda Pal

## 2011-09-26 NOTE — Progress Notes (Signed)
UR COMPLETED  

## 2011-09-26 NOTE — Evaluation (Signed)
Physical Therapy Evaluation Patient Details Name: Kristy Watson MRN: [id number] DOB: [birth year] Today's Date: 09/26/2011 Time:  -     PT Assessment / Plan / Recommendation Clinical Impression       PT Assessment  Patient needs continued PT services    Follow Up Recommendations  Home health PT    Barriers to Discharge        Equipment Recommendations       Recommendations for Other Services OT consult   Frequency 7X/week    Precautions / Restrictions Precautions Precautions: None Restrictions Weight Bearing Restrictions: No Other Position/Activity Restrictions: WBAT   Pertinent Vitals/Pain Min c/o pain      Mobility  Bed Mobility Bed Mobility: Supine to Sit Supine to Sit: 3: Mod assist Details for Bed Mobility Assistance: cues for sequence and use of UEs to self assist Transfers Transfers: Sit to Stand;Stand to Sit Sit to Stand: 4: Min assist;3: Mod assist Stand to Sit: 4: Min assist;3: Mod assist Details for Transfer Assistance: cues for use of UEs and for LE management Ambulation/Gait Ambulation/Gait Assistance: 4: Min assist;3: Mod assist Assistive device: Rolling walker Gait Pattern: Step-to pattern    Exercises Total Joint Exercises Ankle Circles/Pumps: AROM;10 reps;Supine;Both Heel Slides: AAROM;10 reps;Supine;Right Hip ABduction/ADduction: AAROM;10 reps;Supine;Right   PT Diagnosis: Difficulty walking  PT Problem List: Decreased strength;Decreased range of motion;Decreased activity tolerance;Decreased mobility;Decreased knowledge of use of DME;Pain PT Treatment Interventions: DME instruction;Gait training;Stair training;Functional mobility training;Therapeutic activities;Therapeutic exercise;Patient/family education   PT Goals Acute Rehab PT Goals PT Goal Formulation: With patient Time For Goal Achievement: 10/02/11 Potential to Achieve Goals: Good Pt will go Supine/Side to Sit: with supervision Pt will go Sit to Supine/Side: with supervision Pt  will go Sit to Stand: with supervision Pt will go Stand to Sit: with supervision Pt will Ambulate: 51 - 150 feet;with supervision;with rolling walker Pt will Go Up / Down Stairs: 1-2 stairs;with min assist;with least restrictive assistive device  Visit Information  Last PT Received On: 09/26/11 Assistance Needed: +1    Subjective Data  Subjective: I can't believe it hurts less than before surgery Patient Stated Goal: Resume previous lifestyle with decreased pain   Prior Functioning  Home Living Lives With: Spouse Available Help at Discharge: Family Type of Home: House Home Access: Stairs to enter Secretary/administrator of Steps: 1 Home Layout: One level Home Adaptive Equipment: None Prior Function Level of Independence: Independent Able to Take Stairs?: Yes Communication Communication: No difficulties    Cognition  Overall Cognitive Status: Appears within functional limits for tasks assessed/performed Arousal/Alertness: Awake/alert Orientation Level: Appears intact for tasks assessed Behavior During Session: [facility] for tasks performed    Extremity/Trunk Assessment Right Upper Extremity Assessment RUE ROM/Strength/Tone: [facility] for tasks assessed Left Upper Extremity Assessment LUE ROM/Strength/Tone: WFL for tasks assessed Right Lower Extremity Assessment RLE ROM/Strength/Tone: [facility] for tasks assessed Left Lower Extremity Assessment LLE ROM/Strength/Tone: Deficits LLE ROM/Strength/Tone Deficits: AAROM to 90 hip flex and 20 hip abd with hip strength at 2+/5   Balance    End of Session PT - End of Session Equipment Utilized During Treatment: Gait belt Activity Tolerance: Patient tolerated treatment well Patient left: in chair;with call bell/phone within reach;with family/visitor present Nurse Communication: Mobility status  GP     Bruna Dills 09/26/2011, 4:02 PM

## 2011-09-26 NOTE — Anesthesia Preprocedure Evaluation (Addendum)
Anesthesia Evaluation  Patient identified by MRN, date of birth, ID band Patient awake    Reviewed: Allergy & Precautions, H&P , NPO status , Patient's Chart, lab work & pertinent test results  Airway Mallampati: II TM Distance: >3 FB Neck ROM: Full    Dental No notable dental hx.    Pulmonary former smoker,  breath sounds clear to auscultation  Pulmonary exam normal       Cardiovascular hypertension, Pt. on medications Rhythm:Regular Rate:Normal  ECG reviewed.   Neuro/Psych negative neurological ROS  negative psych ROS   GI/Hepatic Neg liver ROS, GERD-  Medicated,  Endo/Other  negative endocrine ROS  Renal/GU negative Renal ROS  negative genitourinary   Musculoskeletal negative musculoskeletal ROS (+)   Abdominal   Peds negative pediatric ROS (+)  Hematology negative hematology ROS (+)   Anesthesia Other Findings   Reproductive/Obstetrics negative OB ROS                          Anesthesia Physical Anesthesia Plan  ASA: II  Anesthesia Plan: General   Post-op Pain Management:    Induction: Intravenous  Airway Management Planned: Oral ETT  Additional Equipment:   Intra-op Plan:   Post-operative Plan: Extubation in OR  Informed Consent: I have reviewed the patients History and Physical, chart, labs and discussed the procedure including the risks, benefits and alternatives for the proposed anesthesia with the patient or authorized representative who has indicated his/her understanding and acceptance.   Dental advisory given  Plan Discussed with: CRNA  Anesthesia Plan Comments: (Discussed spinal versus general and patient prefers general.)       Anesthesia Quick Evaluation

## 2011-09-26 NOTE — Transfer of Care (Signed)
Immediate Anesthesia Transfer of Care Note  Patient: Kristy Watson  Procedure(s) Performed: Procedure(s) (LRB): TOTAL HIP ARTHROPLASTY ANTERIOR APPROACH (Right)  Patient Location: PACU  Anesthesia Type: General  Level of Consciousness: awake, sedated and patient cooperative  Airway & Oxygen Therapy: Patient Spontanous Breathing and Patient connected to face mask oxygen  Post-op Assessment: Report given to PACU RN and Post -op Vital signs reviewed and stable  Post vital signs: Reviewed and stable  Complications: No apparent anesthesia complications

## 2011-09-27 ENCOUNTER — Encounter (HOSPITAL_COMMUNITY): Payer: Self-pay | Admitting: Orthopedic Surgery

## 2011-09-27 LAB — CBC
HCT: 24.3 % — ABNORMAL LOW (ref 36.0–46.0)
MCH: 30.7 pg (ref 26.0–34.0)
MCV: 93.1 fL (ref 78.0–100.0)
Platelets: 195 10*3/uL (ref 150–400)
RBC: 2.61 MIL/uL — ABNORMAL LOW (ref 3.87–5.11)
WBC: 7.3 10*3/uL (ref 4.0–10.5)

## 2011-09-27 LAB — BASIC METABOLIC PANEL
BUN: 6 mg/dL (ref 6–23)
CO2: 26 mEq/L (ref 19–32)
Calcium: 8 mg/dL — ABNORMAL LOW (ref 8.4–10.5)
Chloride: 99 mEq/L (ref 96–112)
Creatinine, Ser: 0.68 mg/dL (ref 0.50–1.10)
Glucose, Bld: 119 mg/dL — ABNORMAL HIGH (ref 70–99)

## 2011-09-27 MED ORDER — METHOCARBAMOL 500 MG PO TABS: 500.0000 mg | ORAL_TABLET | Freq: Four times a day (QID) | ORAL | Status: AC | PRN

## 2011-09-27 MED ORDER — ASPIRIN EC 325 MG PO TBEC: 325.0000 mg | DELAYED_RELEASE_TABLET | Freq: Two times a day (BID) | ORAL | Status: AC

## 2011-09-27 MED ORDER — POLYETHYLENE GLYCOL 3350 17 G PO PACK: 17.0000 g | PACK | Freq: Two times a day (BID) | ORAL | Status: AC

## 2011-09-27 MED ORDER — DIPHENHYDRAMINE HCL 25 MG PO CAPS: 25.0000 mg | ORAL_CAPSULE | Freq: Four times a day (QID) | ORAL | Status: AC | PRN

## 2011-09-27 MED ORDER — FERROUS SULFATE 325 (65 FE) MG PO TABS: 325.0000 mg | ORAL_TABLET | Freq: Three times a day (TID) | ORAL | Status: DC

## 2011-09-27 MED ORDER — TRAMADOL HCL 50 MG PO TABS
50.0000 mg | ORAL_TABLET | Freq: Four times a day (QID) | ORAL | Status: DC | PRN
  Administered 2011-09-27 – 2011-09-28 (×3): 100 mg via ORAL
  Filled 2011-09-27 (×3): qty 2

## 2011-09-27 MED ORDER — HYDROCODONE-ACETAMINOPHEN 7.5-325 MG PO TABS: 1.0000 | ORAL_TABLET | ORAL | Status: DC | PRN

## 2011-09-27 MED ORDER — DSS 100 MG PO CAPS: 100.0000 mg | ORAL_CAPSULE | Freq: Two times a day (BID) | ORAL | Status: AC

## 2011-09-27 NOTE — Progress Notes (Signed)
   Subjective: 1 Day Post-Op Procedure(s) (LRB): TOTAL HIP ARTHROPLASTY ANTERIOR APPROACH (Right)   Patient reports pain as mild, pain well controlled. No events throughout the night.  Objective:   VITALS:   Filed Vitals:   09/27/11 0550  BP: 131/85  Pulse: 76  Temp: 97.8 F (36.6 C)  Resp: 14    Neurovascular intact Dorsiflexion/Plantar flexion intact Incision: dressing C/D/I No cellulitis present Compartment soft  LABS  Basename 09/27/11 0415  HGB 8.0*  HCT 24.3*  WBC 7.3  PLT 195     Basename 09/27/11 0415  NA 131*  K 4.4  BUN 6  CREATININE 0.68  GLUCOSE 119*     Assessment/Plan: 1 Day Post-Op Procedure(s) (LRB): TOTAL HIP ARTHROPLASTY ANTERIOR APPROACH (Right)   Foley cath d/c'ed HV drain d/c'ed Advance diet Up with therapy D/C IV fluids Discharge home with home health if does well with PT Follow up in 2 weeks at [facility].  Follow-up Information    Follow up with OLIN,Renika Shiflet D in 2 weeks.   Contact information:   [facility] [address] [place] [address] [phone]             Anastasio Auerbach. Timara Loma   PAC  09/27/2011, 9:17 AM

## 2011-09-27 NOTE — Progress Notes (Signed)
Physical Therapy Treatment Patient Details Name: Kristy Watson MRN: [id number] DOB: 12/20/1945 Today's Date: 09/27/2011 Time: 4098-1191 PT Time Calculation (min): 31 min  PT Assessment / Plan / Recommendation Comments on Treatment Session       Follow Up Recommendations  Home health PT    Barriers to Discharge        Equipment Recommendations       Recommendations for Other Services OT consult  Frequency 7X/week   Plan Discharge plan remains appropriate    Precautions / Restrictions Precautions Precautions: None Restrictions Weight Bearing Restrictions: No Other Position/Activity Restrictions: WBAT   Pertinent Vitals/Pain Min c/o pain; premedicated    Mobility  Bed Mobility Bed Mobility: Supine to Sit Supine to Sit: 4: Min assist Details for Bed Mobility Assistance: cues for sequence and use of UEs to self assist Transfers Transfers: Sit to Stand;Stand to Sit Sit to Stand: 4: Min assist Stand to Sit: 4: Min assist Details for Transfer Assistance: cues for use of UEs and for LE management Ambulation/Gait Ambulation/Gait Assistance: 4: Min assist Ambulation Distance (Feet): 142 Feet Assistive device: Rolling walker Ambulation/Gait Assistance Details: cues for posture, sequence, position from RW and ER on R Gait Pattern: Step-to pattern    Exercises Total Joint Exercises Ankle Circles/Pumps: AROM;20 reps;Supine;Both Quad Sets: AROM;10 reps;Both;Supine Gluteal Sets: AROM;10 reps;Supine;Both Heel Slides: AAROM;AROM;20 reps;Supine;Right Hip ABduction/ADduction: AAROM;20 reps;Supine;Right   PT Diagnosis:    PT Problem List:   PT Treatment Interventions:     PT Goals Acute Rehab PT Goals PT Goal Formulation: With patient Time For Goal Achievement: 10/02/11 Potential to Achieve Goals: Good Pt will go Supine/Side to Sit: with supervision PT Goal: Supine/Side to Sit - Progress: Progressing toward goal Pt will go Sit to Supine/Side: with supervision PT Goal: Sit  to Supine/Side - Progress: Progressing toward goal Pt will go Sit to Stand: with supervision PT Goal: Sit to Stand - Progress: Progressing toward goal Pt will go Stand to Sit: with supervision PT Goal: Stand to Sit - Progress: Progressing toward goal Pt will Ambulate: 51 - 150 feet;with supervision;with rolling walker PT Goal: Ambulate - Progress: Progressing toward goal Pt will Go Up / Down Stairs: 1-2 stairs;with min assist;with least restrictive assistive device  Visit Information  Last PT Received On: 09/27/11 Assistance Needed: +1    Subjective Data  Subjective:  (I still can't believe how much this doesn't hurt) Patient Stated Goal: Resume previous lifestyle with decreased pain   Cognition  Overall Cognitive Status: Appears within functional limits for tasks assessed/performed Arousal/Alertness: Awake/alert Orientation Level: Appears intact for tasks assessed Behavior During Session: [facility] for tasks performed    Balance     End of Session PT - End of Session Activity Tolerance: Patient tolerated treatment well;Other (comment) (ltd by onset of nausea) Patient left: in chair;with call bell/phone within reach;with family/visitor present Nurse Communication: Mobility status   GP     Kristy Watson 09/27/2011, 10:35 AM

## 2011-09-27 NOTE — Progress Notes (Signed)
CARE MANAGEMENT NOTE 09/27/2011  Patient:  Kristy Watson, Kristy Watson   Account Number:  [id number]  Date Initiated:  09/27/2011  Documentation initiated by:  Colleen Can  Subjective/Objective Assessment:   dx osteoarthritis right hip; total hip replacemnt     Action/Plan:   CM spoke with patient and spouse. Planms are for patient to return to her home where spouse will be caregiver. Pt lives in [place]. case was referred to [place] but agency unable to service patient.   Anticipated DC Date:  09/28/2011   Anticipated DC Plan:  HOME W HOME HEALTH SERVICES  In-house referral  Clinical Social Worker      DC Planning Services  CM consult      [facility] Choice  HOME HEALTH  DURABLE MEDICAL EQUIPMENT   Choice offered to / List presented to:  C-1 Patient   DME arranged  [facility]      DME agency  [employer]     HH arranged  HH-2 PT      [facility] agency  [facility]   Status of service:  In process, will continue to follow Medicare Important Message given?  NA - LOS <3 / Initial given by admissions (If response is "NO", the following Medicare IM given date fields will be blank) Comments:  09/27/2011 Raynelle Bring BSN CCM [phone] [facility] notified and can provide [facility] services with start date of Friday. RW to be delivered to pt's room. CM will follow

## 2011-09-27 NOTE — Evaluation (Signed)
Occupational Therapy Evaluation Patient Details Name: Kristy Watson MRN: [id number] DOB: 1945-05-29 Today's Date: 09/27/2011 Time: 1030-1056 OT Time Calculation (min): 26 min  OT Assessment / Plan / Recommendation Clinical Impression  This 65 year old female was admitted for L anterior direct approach THA.  All education was completed and pt will not need any further OT at this time.  She has a comfort height commode and will try this at home; she was min guard today with this transfer. No OT DME needs at this time.      OT Assessment  Patient does not need any further OT services    Follow Up Recommendations  No OT follow up    Barriers to Discharge      Equipment Recommendations  None recommended by OT    Recommendations for Other Services    Frequency       Precautions / Restrictions Precautions Precautions: None Restrictions Weight Bearing Restrictions: No Other Position/Activity Restrictions: WBAT   Pertinent Vitals/Pain No pain; mild nausea    ADL  Grooming: Performed;Supervision/safety Where Assessed - Grooming: Supported standing Upper Body Bathing: Simulated;Set up Where Assessed - Upper Body Bathing: Supported sitting Lower Body Bathing: Performed;Minimal assistance Where Assessed - Lower Body Bathing: Supported sit to stand Upper Body Dressing: Simulated;Set up Where Assessed - Upper Body Dressing: Supported sitting Lower Body Dressing: Minimal assistance;Performed Where Assessed - Lower Body Dressing: Supported sit to stand Toilet Transfer: Performed;Min guard Statistician Method: Sit to Barista: Comfort height toilet;Grab bars Toileting - Architect and Hygiene: Set up;Performed Where Assessed - Engineer, mining and Hygiene: Lean right and/or left Tub/Shower Transfer: Performed;Min guard Web designer Method: Science writer: Walk in Scientist, research (physical sciences) Used: Retail buyer Transfers/Ambulation Related to ADLs: min guard ambulating to bathroom ADL Comments: doesn't have reacher:  she may be able to reach LLE soon without; husband can help.      OT Diagnosis:    OT Problem List:   OT Treatment Interventions:     OT Goals    Visit Information  Last OT Received On: 09/27/11 Assistance Needed: +1    Subjective Data  Subjective: "I think my toilet is higher than this one" Patient Stated Goal: none   Prior Functioning  Vision/Perception  Home Living Lives With: Spouse Bathroom Shower/Tub: Tub/shower unit;Walk-in shower Bathroom Toilet: Handicapped height Home Adaptive Equipment:  (built in shower seat) Prior Function Level of Independence: Independent Communication Communication: No difficulties      Cognition  Overall Cognitive Status: Appears within functional limits for tasks assessed/performed Arousal/Alertness: Awake/alert Orientation Level: Appears intact for tasks assessed Behavior During Session: [facility] for tasks performed    Extremity/Trunk Assessment Right Upper Extremity Assessment RUE ROM/Strength/Tone: [facility] for tasks assessed Left Upper Extremity Assessment LUE ROM/Strength/Tone: WFL for tasks assessed   Mobility  Transfers Sit to Stand: 4: Min guard;From chair/3-in-1  Details for Transfer Assistance: cues for use of UEs and for LE management   Exercise   Balance    End of Session OT - End of Session Activity Tolerance: Patient tolerated treatment well (has been getting nauseated; just a little during OT) Patient left: in chair;with call bell/phone within reach  GO     [facility] 09/27/2011, 11:37 AM Marica Otter, OTR/L [phone] 09/27/2011

## 2011-09-27 NOTE — Progress Notes (Signed)
Physical Therapy Treatment Patient Details Name: Lasharn Bufkin MRN: [id number] DOB: 01-Jul-1945 Today's Date: 09/27/2011 Time: 2130-8657 PT Time Calculation (min): 15 min  PT Assessment / Plan / Recommendation Comments on Treatment Session       Follow Up Recommendations  Home health PT    Barriers to Discharge        Equipment Recommendations  None recommended by OT    Recommendations for Other Services OT consult  Frequency 7X/week   Plan Discharge plan remains appropriate    Precautions / Restrictions Precautions Precautions: None Restrictions Weight Bearing Restrictions: No Other Position/Activity Restrictions: WBAT   Pertinent Vitals/Pain 4/10    Mobility  Bed Mobility Bed Mobility: Sit to Supine Sit to Supine: 4: Min assist Details for Bed Mobility Assistance: cues for sequence and use of UEs to self assist Transfers Transfers: Sit to Stand;Stand to Sit Sit to Stand: 4: Min guard;From chair/3-in-1 Stand to Sit: 4: Min guard Details for Transfer Assistance: cues for use of UEs and for LE management Ambulation/Gait Ambulation/Gait Assistance: 4: Min guard Ambulation Distance (Feet): 250 Feet Assistive device: Rolling walker Ambulation/Gait Assistance Details: cues for posture and position from RW.  Pt progressing to recip gait Gait Pattern: Step-to pattern    Exercises     PT Diagnosis:    PT Problem List:   PT Treatment Interventions:     PT Goals Acute Rehab PT Goals PT Goal Formulation: With patient Time For Goal Achievement: 10/02/11 Potential to Achieve Goals: Good Pt will go Supine/Side to Sit: with supervision PT Goal: Supine/Side to Sit - Progress: Progressing toward goal Pt will go Sit to Supine/Side: with supervision PT Goal: Sit to Supine/Side - Progress: Progressing toward goal Pt will go Sit to Stand: with supervision PT Goal: Sit to Stand - Progress: Progressing toward goal Pt will go Stand to Sit: with supervision PT Goal: Stand to Sit  - Progress: Progressing toward goal Pt will Ambulate: 51 - 150 feet;with supervision;with rolling walker PT Goal: Ambulate - Progress: Progressing toward goal Pt will Go Up / Down Stairs: 1-2 stairs;with min assist;with least restrictive assistive device  Visit Information  Last PT Received On: 09/27/11 Assistance Needed: +1    Subjective Data  Subjective: I'm feeling better than this morning Patient Stated Goal: Resume previous lifestyle with decreased pain   Cognition  Overall Cognitive Status: Appears within functional limits for tasks assessed/performed Arousal/Alertness: Awake/alert Orientation Level: Appears intact for tasks assessed Behavior During Session: [facility] for tasks performed    Balance     End of Session PT - End of Session Activity Tolerance: Patient tolerated treatment well Patient left: in bed;with call bell/phone within reach;with family/visitor present Nurse Communication: Mobility status   GP     Essa Wenk 09/27/2011, 2:52 PM

## 2011-09-28 DIAGNOSIS — IMO0002 Reserved for concepts with insufficient information to code with codable children: Secondary | ICD-10-CM | POA: Diagnosis present

## 2011-09-28 LAB — CBC
HCT: 26.2 % — ABNORMAL LOW (ref 36.0–46.0)
MCH: 30.9 pg (ref 26.0–34.0)
MCHC: 32.8 g/dL (ref 30.0–36.0)
MCV: 94.2 fL (ref 78.0–100.0)
Platelets: 236 10*3/uL (ref 150–400)
RDW: 15 % (ref 11.5–15.5)
WBC: 6.3 10*3/uL (ref 4.0–10.5)

## 2011-09-28 LAB — BASIC METABOLIC PANEL
BUN: 7 mg/dL (ref 6–23)
Calcium: 9 mg/dL (ref 8.4–10.5)
Chloride: 95 mEq/L — ABNORMAL LOW (ref 96–112)
Creatinine, Ser: 0.73 mg/dL (ref 0.50–1.10)
GFR calc Af Amer: >90 mL/min (ref >90)
GFR calc non Af Amer: 87 mL/min — ABNORMAL LOW (ref >90)

## 2011-09-28 MED ORDER — TRAMADOL HCL 50 MG PO TABS: 50.0000 mg | ORAL_TABLET | Freq: Four times a day (QID) | ORAL | Status: AC | PRN

## 2011-09-28 NOTE — Progress Notes (Signed)
Physical Therapy Treatment Patient Details Name: Sussan Meter MRN: [id number] DOB: [birth year] Today's Date: 09/28/2011 Time: 4098-1191 PT Time Calculation (min): 25 min  PT Assessment / Plan / Recommendation Comments on Treatment Session  reviewed car transfers with pt and spouse    Follow Up Recommendations  Home health PT    Barriers to Discharge        Equipment Recommendations  None recommended by OT    Recommendations for Other Services OT consult  Frequency 7X/week   Plan Discharge plan remains appropriate    Precautions / Restrictions Precautions Precautions: None Restrictions Weight Bearing Restrictions: No Other Position/Activity Restrictions: WBAT   Pertinent Vitals/Pain 3/10    Mobility  Bed Mobility Bed Mobility: Supine to Sit Supine to Sit: 5: Supervision Transfers Transfers: Sit to Stand;Stand to Sit Sit to Stand: 5: Supervision Stand to Sit: 5: Supervision Details for Transfer Assistance: cues for use of UEs and for LE management Ambulation/Gait Ambulation/Gait Assistance: 5: Supervision Ambulation Distance (Feet): 175 Feet Assistive device: Rolling walker Ambulation/Gait Assistance Details: min cues for position from RW and ER on R Gait Pattern: Step-to pattern;Step-through pattern Stairs: Yes Stairs Assistance: 4: Min assist Stairs Assistance Details (indicate cue type and reason): cues for sequence and placement of RW/feet Stair Management Technique: Forwards;With walker;Step to pattern;No rails Number of Stairs: 1  (twice)    Exercises Total Joint Exercises Ankle Circles/Pumps: AROM;20 reps;Supine;Both Gluteal Sets: AROM;20 reps;Supine;Both Heel Slides: AAROM;AROM;20 reps;Supine;Right Hip ABduction/ADduction: AAROM;20 reps;Supine;Right Long Arc Quad: AROM;Supine;15 reps;Both   PT Diagnosis:    PT Problem List:   PT Treatment Interventions:     PT Goals Acute Rehab PT Goals PT Goal Formulation: With patient Time For Goal Achievement:  10/02/11 Potential to Achieve Goals: Good Pt will go Supine/Side to Sit: with supervision PT Goal: Supine/Side to Sit - Progress: Met Pt will go Sit to Supine/Side: with supervision PT Goal: Sit to Supine/Side - Progress: Progressing toward goal Pt will go Sit to Stand: with supervision PT Goal: Sit to Stand - Progress: Met Pt will go Stand to Sit: with supervision PT Goal: Stand to Sit - Progress: Met Pt will Ambulate: 51 - 150 feet;with supervision;with rolling walker PT Goal: Ambulate - Progress: Met Pt will Go Up / Down Stairs: 1-2 stairs;with min assist;with least restrictive assistive device PT Goal: Up/Down Stairs - Progress: Met  Visit Information  Last PT Received On: 09/28/11 Assistance Needed: +1    Subjective Data  Subjective: I am feeling much better than yesterday Patient Stated Goal: Resume previous lifestyle with decreased pain   Cognition  Overall Cognitive Status: Appears within functional limits for tasks assessed/performed Arousal/Alertness: Awake/alert Orientation Level: Appears intact for tasks assessed Behavior During Session: [facility] for tasks performed    Balance     End of Session PT - End of Session Activity Tolerance: Patient tolerated treatment well Patient left: in chair;with call bell/phone within reach;with family/visitor present Nurse Communication: Mobility status   GP     Finnigan Warriner 09/28/2011, 12:34 PM

## 2011-09-28 NOTE — Discharge Summary (Signed)
Physician Discharge Summary  Patient ID: Kristy Watson MRN: [id number] DOB/AGE: 07/17/1945 66 y.o.  Admit date: 09/26/2011 Discharge date:  09/28/2011  Procedures:  Procedure(s) (LRB): TOTAL HIP ARTHROPLASTY ANTERIOR APPROACH (Right)  Attending Physician:  Dr. Durene Romans   Admission Diagnoses:   Right hip OA and pain   Discharge Diagnoses:  Principal Problem:  *S/P right THA, AA Expected acute blood loss anemia Hypertension   Seasonal allergies   GERD   Arthritis   HPI: Pt is a 66 y.o. female complaining of right hip pain for 1+ years. Pain had continually increased since the beginning, and has been effecting her life more and more. X-rays in the clinic show end-stage arthritic changes of the right hip. Pt has tried various conservative treatments which have failed to alleviate their symptoms, NSAIDs. Various options are discussed with the patient. Risks, benefits and expectations were discussed with the patient. Patient understand the risks, benefits and expectations and wishes to proceed with surgery. Dr. Lysbeth Galas has cleared the patient for surgery.  PCP: Josue Hector, MD   Discharged Condition: good  Hospital Course:  Patient underwent the above stated procedure on 09/26/2011. Patient tolerated the procedure well and brought to the recovery room in good condition and subsequently to the floor.  POD #1 BP: 131/85 ; Pulse: 76 ; Temp: 97.8 F (36.6 C) ; Resp: 14  Pt's foley was removed, as well as the hemovac drain removed. IV was changed to a saline lock. Patient reports pain as mild, pain well controlled. No events throughout the night.  Neurovascular intact, dorsiflexion/plantar flexion intact, incision: dressing C/D/I, no cellulitis present and compartment soft.   LABS  Basename  09/27/11 0415   HGB  8.0  HCT  24.3   POD #2  BP: 135/78 ; Pulse: 74 ; Temp: 98 F (36.7 C) ; Resp: 16  Patient reports pain as mild, pain well controlled. Had a little N&V  yesterday, but felt much better in the afternoon and this morning. No other events throughout the night. Ready to be discharged home. Neurovascular intact, dorsiflexion/plantar flexion intact, incision: dressing C/D/I, no cellulitis present and compartment soft.   LABS  Basename  09/28/11 0405  HGB  8.6  HCT  26.2    Discharge Exam: General appearance: alert, cooperative and no distress Extremities: Homans sign is negative, no sign of DVT, no edema, redness or tenderness in the calves or thighs and no ulcers, gangrene or trophic changes  Disposition:  Home  with follow up in 2 weeks   Follow-up Information    Follow up with Shelda Pal, MD in 2 weeks.   Contact information:   [facility] [address], [address] [place] [address] [phone]          Discharge Orders    Future Orders Please Complete By Expires   Diet - low sodium heart healthy      Call MD / Call 911      Comments:   If you experience chest pain or shortness of breath, CALL 911 and be transported to the hospital emergency room.  If you develope a fever above 101 F, pus (white drainage) or increased drainage or redness at the wound, or calf pain, call your surgeon's office.   Discharge instructions      Comments:   Maintain surgical dressing for 8 days, then replace with gauze and tape. Keep the area dry and clean until follow up. Follow up in 2 weeks at [facility].  Call with any questions or concerns.   Constipation Prevention      Comments:   Drink plenty of fluids.  Prune juice may be helpful.  You may use a stool softener, such as Colace (over the counter) 100 mg twice a day.  Use MiraLax (over the counter) for constipation as needed.   Increase activity slowly as tolerated      Driving restrictions      Comments:   No driving for 4 weeks   Change dressing      Comments:   Maintain surgical dressing for 8 days, then replace with 4x4 guaze and tape.  Keep the area dry and clean.   TED hose      Comments:   Use stockings (TED hose) for 2 weeks on both leg(s).  You may remove them at night for sleeping.      Current Discharge Medication List    START taking these medications   Details  aspirin EC 325 MG tablet Take 1 tablet (325 mg total) by mouth 2 (two) times daily. X 4 weeks Qty: 60 tablet, Refills: 0    diphenhydrAMINE (BENADRYL) 25 mg capsule Take 1 capsule (25 mg total) by mouth every 6 (six) hours as needed for itching, allergies or sleep. Qty: 30 capsule    docusate sodium 100 MG CAPS Take 100 mg by mouth 2 (two) times daily. Qty: 10 capsule    ferrous sulfate 325 (65 FE) MG tablet Take 1 tablet (325 mg total) by mouth 3 (three) times daily after meals.    methocarbamol (ROBAXIN) 500 MG tablet Take 1 tablet (500 mg total) by mouth every 6 (six) hours as needed (muscle spasms).    polyethylene glycol (MIRALAX / GLYCOLAX) packet Take 17 g by mouth 2 (two) times daily. Qty: 14 each    traMADol (ULTRAM) 50 MG tablet Take 1-2 tablets (50-100 mg total) by mouth every 6 (six) hours as needed for pain. Qty: 100 tablet, Refills: 0      CONTINUE these medications which have NOT CHANGED   Details  amLODipine-benazepril (LOTREL) 5-20 MG per capsule Take 1 capsule by mouth daily before breakfast.    cholecalciferol (VITAMIN D) 1000 UNITS tablet Take 1,000 Units by mouth daily.    esomeprazole (NEXIUM) 20 MG capsule Take 20 mg by mouth every evening.    estrogens, conjugated, (PREMARIN) 0.625 MG tablet Take 0.625 mg by mouth at bedtime. Take daily for 21 days then do not take for 7 days.    fluticasone (FLONASE) 50 MCG/ACT nasal spray Place 2 sprays into the nose daily as needed. Allergies    Fluticasone-Salmeterol (ADVAIR) 100-50 MCG/DOSE AEPB Inhale 1 puff into the lungs 2 (two) times daily as needed. Wheezing    LORazepam (ATIVAN) 0.5 MG tablet Take 0.5 mg by mouth every 8 (eight) hours as needed. Anxiety    albuterol  (PROVENTIL HFA;VENTOLIN HFA) 108 (90 BASE) MCG/ACT inhaler Inhale 2 puffs into the lungs every 6 (six) hours as needed. Wheezing    fish oil-omega-3 fatty acids 1000 MG capsule Take 1 g by mouth 2 (two) times daily. 2 in am, 1 in pm      STOP taking these medications     acetaminophen (TYLENOL) 500 MG tablet Comments:  Reason for Stopping:       cyclobenzaprine (FLEXERIL) 5 MG tablet Comments:  Reason for Stopping:       Naproxen-Esomeprazole (VIMOVO) 500-20 MG TBEC Comments:  Reason for Stopping:  Signed: Anastasio Auerbach. Janila Arrazola   PAC  09/28/2011, 9:09 AM

## 2011-09-28 NOTE — Progress Notes (Addendum)
   Subjective: 2 Days Post-Op Procedure(s) (LRB): TOTAL HIP ARTHROPLASTY ANTERIOR APPROACH (Right)   Patient reports pain as mild, pain well controlled. Had a little N&V yesterday, but felt much better in the afternoon and this morning. No other events throughout the night. Ready to be discharged home.  Objective:   VITALS:   Filed Vitals:   09/28/11 0455  BP: 135/78  Pulse: 74  Temp: 98 F (36.7 C)  Resp: 16    Neurovascular intact Dorsiflexion/Plantar flexion intact Incision: dressing C/D/I No cellulitis present Compartment soft  LABS  Basename 09/28/11 0405 09/27/11 0415  HGB 8.6* 8.0*  HCT 26.2* 24.3*  WBC 6.3 7.3  PLT 236 195     Basename 09/28/11 0405 09/27/11 0415  NA 130* 131*  K 4.3 4.4  BUN 7 6  CREATININE 0.73 0.68  GLUCOSE 104* 119*     Assessment/Plan: 2 Days Post-Op Procedure(s) (LRB): TOTAL HIP ARTHROPLASTY ANTERIOR APPROACH (Right)  Expected acute blood loss anemia post-op, treating with Iron Up with therapy Discharge home with home health Follow up in 2 weeks at [facility].  Follow-up Information    Follow up with OLIN,Avanna Sowder D in 2 weeks.   Contact information:   [facility] [address] [place] [address] [phone]           Anastasio Auerbach. Jury Caserta   PAC  09/28/2011, 8:51 AM

## 2012-04-01 ENCOUNTER — Ambulatory Visit: Payer: Medicare Other | Attending: Orthopedic Surgery | Admitting: Physical Therapy

## 2012-04-01 DIAGNOSIS — R5381 Other malaise: Secondary | ICD-10-CM | POA: Insufficient documentation

## 2012-04-01 DIAGNOSIS — M546 Pain in thoracic spine: Secondary | ICD-10-CM | POA: Insufficient documentation

## 2012-04-01 DIAGNOSIS — M542 Cervicalgia: Secondary | ICD-10-CM | POA: Insufficient documentation

## 2012-04-01 DIAGNOSIS — IMO0001 Reserved for inherently not codable concepts without codable children: Secondary | ICD-10-CM | POA: Insufficient documentation

## 2012-04-04 ENCOUNTER — Ambulatory Visit: Payer: Medicare Other | Admitting: Physical Therapy

## 2012-04-09 ENCOUNTER — Ambulatory Visit: Payer: Medicare Other | Admitting: Physical Therapy

## 2012-04-11 ENCOUNTER — Ambulatory Visit: Payer: Medicare Other | Admitting: Physical Therapy

## 2012-04-15 ENCOUNTER — Encounter: Payer: Medicare Other | Admitting: Physical Therapy

## 2012-04-30 ENCOUNTER — Ambulatory Visit: Payer: Medicare Other | Attending: Orthopedic Surgery | Admitting: Physical Therapy

## 2012-04-30 DIAGNOSIS — R5381 Other malaise: Secondary | ICD-10-CM | POA: Insufficient documentation

## 2012-04-30 DIAGNOSIS — M542 Cervicalgia: Secondary | ICD-10-CM | POA: Insufficient documentation

## 2012-04-30 DIAGNOSIS — M546 Pain in thoracic spine: Secondary | ICD-10-CM | POA: Insufficient documentation

## 2012-04-30 DIAGNOSIS — IMO0001 Reserved for inherently not codable concepts without codable children: Secondary | ICD-10-CM | POA: Insufficient documentation

## 2012-05-01 ENCOUNTER — Ambulatory Visit: Payer: Medicare Other | Admitting: *Deleted

## 2012-05-06 ENCOUNTER — Ambulatory Visit: Payer: Medicare Other | Admitting: Physical Therapy

## 2012-05-08 ENCOUNTER — Ambulatory Visit: Payer: Medicare Other | Admitting: Physical Therapy

## 2012-05-13 ENCOUNTER — Ambulatory Visit: Payer: Medicare Other | Admitting: Physical Therapy

## 2013-04-04 ENCOUNTER — Other Ambulatory Visit: Payer: Self-pay | Admitting: Neurosurgery

## 2013-04-04 DIAGNOSIS — M1990 Unspecified osteoarthritis, unspecified site: Secondary | ICD-10-CM

## 2013-04-23 ENCOUNTER — Ambulatory Visit
Admission: RE | Admit: 2013-04-23 | Discharge: 2013-04-23 | Disposition: A | Discharge status: Home / Self Care | Payer: Medicare Other | Source: Ambulatory Visit | Attending: Neurosurgery | Admitting: Neurosurgery

## 2013-04-23 DIAGNOSIS — M1990 Unspecified osteoarthritis, unspecified site: Secondary | ICD-10-CM

## 2015-04-19 ENCOUNTER — Other Ambulatory Visit: Payer: Self-pay

## 2015-04-19 ENCOUNTER — Encounter (HOSPITAL_COMMUNITY): Payer: Self-pay

## 2015-04-19 ENCOUNTER — Encounter (HOSPITAL_COMMUNITY)
Admission: RE | Admit: 2015-04-19 | Discharge: 2015-04-19 | Disposition: A | Discharge status: Home / Self Care | Payer: Medicare Other | Source: Ambulatory Visit | Attending: Orthopedic Surgery | Admitting: Orthopedic Surgery

## 2015-04-19 DIAGNOSIS — Z0183 Encounter for blood typing: Secondary | ICD-10-CM | POA: Insufficient documentation

## 2015-04-19 DIAGNOSIS — R9431 Abnormal electrocardiogram [ECG] [EKG]: Secondary | ICD-10-CM | POA: Diagnosis not present

## 2015-04-19 DIAGNOSIS — M1612 Unilateral primary osteoarthritis, left hip: Secondary | ICD-10-CM | POA: Insufficient documentation

## 2015-04-19 DIAGNOSIS — Z01812 Encounter for preprocedural laboratory examination: Secondary | ICD-10-CM | POA: Insufficient documentation

## 2015-04-19 DIAGNOSIS — Z01818 Encounter for other preprocedural examination: Secondary | ICD-10-CM | POA: Diagnosis present

## 2015-04-19 HISTORY — DX: Pneumonia, unspecified organism: J18.9

## 2015-04-19 HISTORY — DX: Unspecified hemorrhoids: K64.9

## 2015-04-19 HISTORY — DX: Rash and other nonspecific skin eruption: R21

## 2015-04-19 HISTORY — DX: Dizziness and giddiness: R42

## 2015-04-19 HISTORY — DX: Personal history of transient ischemic attack (TIA), and cerebral infarction without residual deficits: Z86.73

## 2015-04-19 HISTORY — DX: Dermatitis, unspecified: L30.9

## 2015-04-19 LAB — CBC
HCT: 38.9 % (ref 36.0–46.0)
HEMOGLOBIN: 12.7 g/dL (ref 12.0–15.0)
MCH: 31.4 pg (ref 26.0–34.0)
MCHC: 32.6 g/dL (ref 30.0–36.0)
MCV: 96 fL (ref 78.0–100.0)
PLATELETS: 246 10*3/uL (ref 150–400)
RBC: 4.05 MIL/uL (ref 3.87–5.11)
RDW: 13.5 % (ref 11.5–15.5)
WBC: 6.5 10*3/uL (ref 4.0–10.5)

## 2015-04-19 LAB — SURGICAL PCR SCREEN
MRSA, PCR: NEGATIVE
STAPHYLOCOCCUS AUREUS: POSITIVE — AB

## 2015-04-19 LAB — URINALYSIS, ROUTINE W REFLEX MICROSCOPIC
Bilirubin Urine: NEGATIVE
Glucose, UA: NEGATIVE mg/dL
Hgb urine dipstick: NEGATIVE
KETONES UR: NEGATIVE mg/dL
NITRITE: NEGATIVE
PROTEIN: NEGATIVE mg/dL
Specific Gravity, Urine: 1.014 (ref 1.005–1.030)
pH: 7 (ref 5.0–8.0)

## 2015-04-19 LAB — BASIC METABOLIC PANEL
ANION GAP: 7 (ref 5–15)
BUN: 14 mg/dL (ref 6–20)
CALCIUM: 9.7 mg/dL (ref 8.9–10.3)
CO2: 27 mmol/L (ref 22–32)
CREATININE: 0.94 mg/dL (ref 0.44–1.00)
Chloride: 92 mmol/L — ABNORMAL LOW (ref 101–111)
Glucose, Bld: 96 mg/dL (ref 65–99)
Potassium: 4.8 mmol/L (ref 3.5–5.1)
Sodium: 126 mmol/L — ABNORMAL LOW (ref 135–145)

## 2015-04-19 LAB — PROTIME-INR
INR: 0.91 (ref 0.00–1.49)
PROTHROMBIN TIME: 12.5 s (ref 11.6–15.2)

## 2015-04-19 LAB — URINE MICROSCOPIC-ADD ON

## 2015-04-19 LAB — APTT: APTT: 28 s (ref 24–37)

## 2015-04-19 NOTE — Patient Instructions (Addendum)
Kristy Watson  04/19/2015   Your procedure is scheduled on: 04-27-15  [facility]  [facility] to 3rd floor to  Short Stay Center at  0830 AM.  Call this number if you have problems the morning of surgery [phone]   Remember: ONLY 1 PERSON MAY GO WITH YOU TO SHORT STAY TO GET  READY MORNING OF YOUR SURGERY.  Do not eat food or drink liquids :After Midnight.     Take these medicines the morning of surgery with A SIP OF WATER: Amlodipine. Bystolic.  Ativan -if need. Use Advair as need. DO NOT TAKE ANY DIABETIC MEDICATIONS DAY OF YOUR SURGERY                               You may not have any metal on your body including hair pins and              piercings  Do not wear jewelry, make-up, lotions, powders or perfumes, deodorant             Do not wear nail polish.  Do not shave  48 hours prior to surgery.              Men may shave face and neck.   Do not bring valuables to the hospital. [facility] IS NOT             RESPONSIBLE   FOR VALUABLES.  Contacts, dentures or bridgework may not be worn into surgery.  Leave suitcase in the car. After surgery it may be brought to your room.     Patients discharged the day of surgery will not be allowed to drive home.  Name and phone number of your driver: Kristy Watson -spouse [phone]  Special Instructions: N/A              Please read over the following fact sheets you were given: _____________________________________________________________________             [facility] - Preparing for [facility] Before surgery, you can play an important role.  Because skin is not sterile, your skin needs to be as free of germs as possible.  You can reduce the number of germs on your skin by washing with CHG (chlorahexidine gluconate) soap before surgery.  CHG is an antiseptic cleaner which kills germs and bonds with the skin to continue killing germs even after washing. Please DO NOT use if you  have an allergy to CHG or antibacterial soaps.  If your skin becomes reddened/irritated stop using the CHG and inform your nurse when you arrive at Short Stay. Do not shave (including legs and underarms) for at least 48 hours prior to the first CHG shower.  You may shave your face/neck. Please follow these instructions carefully:  1.  Shower with CHG Soap the night before surgery and the  morning of Surgery.  2.  If you choose to wash your hair, wash your hair first as usual with your  normal  shampoo.  3.  After you shampoo, rinse your hair and body thoroughly to remove the  shampoo.                           4.  Use CHG as you would any other liquid soap.  You  can apply chg directly  to the skin and wash                       Gently with a scrungie or clean washcloth.  5.  Apply the CHG Soap to your body ONLY FROM THE NECK DOWN.   Do not use on face/ open                           Wound or open sores. Avoid contact with eyes, ears mouth and genitals (private parts).                       Wash face,  Genitals (private parts) with your normal soap.             6.  Wash thoroughly, paying special attention to the area where your surgery  will be performed.  7.  Thoroughly rinse your body with warm water from the neck down.  8.  DO NOT shower/wash with your normal soap after using and rinsing off  the CHG Soap.                9.  Pat yourself dry with a clean towel.            10.  Wear clean pajamas.            11.  Place clean sheets on your bed the night of your first shower and do not  sleep with pets. Day of Surgery : Do not apply any lotions/deodorants the morning of surgery.  Please wear clean clothes to the hospital/surgery center.  FAILURE TO FOLLOW THESE INSTRUCTIONS MAY RESULT IN THE CANCELLATION OF YOUR SURGERY PATIENT SIGNATURE_________________________________  NURSE  SIGNATURE__________________________________  ________________________________________________________________________   Adam Phenix  An incentive spirometer is a tool that can help keep your lungs clear and active. This tool measures how well you are filling your lungs with each breath. Taking long deep breaths may help reverse or decrease the chance of developing breathing (pulmonary) problems (especially infection) following:  A long period of time when you are unable to move or be active. BEFORE THE PROCEDURE   If the spirometer includes an indicator to show your best effort, your nurse or respiratory therapist will set it to a desired goal.  If possible, sit up straight or lean slightly forward. Try not to slouch.  Hold the incentive spirometer in an upright position. INSTRUCTIONS FOR USE   Sit on the edge of your bed if possible, or sit up as far as you can in bed or on a chair.  Hold the incentive spirometer in an upright position.  Breathe out normally.  Place the mouthpiece in your mouth and seal your lips tightly around it.  Breathe in slowly and as deeply as possible, raising the piston or the ball toward the top of the column.  Hold your breath for 3-5 seconds or for as long as possible. Allow the piston or ball to fall to the bottom of the column.  Remove the mouthpiece from your mouth and breathe out normally.  Rest for a few seconds and repeat Steps 1 through 7 at least 10 times every 1-2 hours when you are awake. Take your time and take a few normal breaths between deep breaths.  The spirometer may include an indicator to show your best effort. Use the indicator as a goal to work toward during  each repetition.  After each set of 10 deep breaths, practice coughing to be sure your lungs are clear. If you have an incision (the cut made at the time of surgery), support your incision when coughing by placing a pillow or rolled up towels firmly against it. Once  you are able to get out of bed, walk around indoors and cough well. You may stop using the incentive spirometer when instructed by your caregiver.  RISKS AND COMPLICATIONS  Take your time so you do not get dizzy or light-headed.  If you are in pain, you may need to take or ask for pain medication before doing incentive spirometry. It is harder to take a deep breath if you are having pain. AFTER USE  Rest and breathe slowly and easily.  It can be helpful to keep track of a log of your progress. Your caregiver can provide you with a simple table to help with this. If you are using the spirometer at home, follow these instructions: [place] IF:   You are having difficultly using the spirometer.  You have trouble using the spirometer as often as instructed.  Your pain medication is not giving enough relief while using the spirometer.  You develop fever of 100.5 F (38.1 C) or higher. SEEK IMMEDIATE MEDICAL CARE IF:   You cough up bloody sputum that had not been present before.  You develop fever of 102 F (38.9 C) or greater.  You develop worsening pain at or near the incision site. MAKE SURE YOU:   Understand these instructions.  Will watch your condition.  Will get help right away if you are not doing well or get worse. Document Released: 06/12/2006 Document Revised: 04/24/2011 Document Reviewed: 08/13/2006 ExitCare Patient Information 2014 ExitCare, [place].   ________________________________________________________________________  WHAT IS A BLOOD TRANSFUSION? Blood Transfusion Information  A transfusion is the replacement of blood or some of its parts. Blood is made up of multiple cells which provide different functions.  Red blood cells carry oxygen and are used for blood loss replacement.  White blood cells fight against infection.  Platelets control bleeding.  Plasma helps clot blood.  Other blood products are available for specialized needs, such as  hemophilia or other clotting disorders. BEFORE THE TRANSFUSION  Who gives blood for transfusions?   Healthy volunteers who are fully evaluated to make sure their blood is safe. This is blood bank blood. Transfusion therapy is the safest it has ever been in the practice of medicine. Before blood is taken from a donor, a complete history is taken to make sure that person has no history of diseases nor engages in risky social behavior (examples are intravenous drug use or sexual activity with multiple partners). The donor's travel history is screened to minimize risk of transmitting infections, such as malaria. The donated blood is tested for signs of infectious diseases, such as HIV and hepatitis. The blood is then tested to be sure it is compatible with you in order to minimize the chance of a transfusion reaction. If you or a relative donates blood, this is often done in anticipation of surgery and is not appropriate for emergency situations. It takes many days to process the donated blood. RISKS AND COMPLICATIONS Although transfusion therapy is very safe and saves many lives, the main dangers of transfusion include:   Getting an infectious disease.  Developing a transfusion reaction. This is an allergic reaction to something in the blood you were given. Every precaution is taken to prevent  this. The decision to have a blood transfusion has been considered carefully by your caregiver before blood is given. Blood is not given unless the benefits outweigh the risks. AFTER THE TRANSFUSION  Right after receiving a blood transfusion, you will usually feel much better and more energetic. This is especially true if your red blood cells have gotten low (anemic). The transfusion raises the level of the red blood cells which carry oxygen, and this usually causes an energy increase.  The nurse administering the transfusion will monitor you carefully for complications. HOME CARE INSTRUCTIONS  No special  instructions are needed after a transfusion. You may find your energy is better. Speak with your caregiver about any limitations on activity for underlying diseases you may have. SEEK MEDICAL CARE IF:   Your condition is not improving after your transfusion.  You develop redness or irritation at the intravenous (IV) site. SEEK IMMEDIATE MEDICAL CARE IF:  Any of the following symptoms occur over the next 12 hours:  Shaking chills.  You have a temperature by mouth above 102 F (38.9 C), not controlled by medicine.  Chest, back, or muscle pain.  People around you feel you are not acting correctly or are confused.  Shortness of breath or difficulty breathing.  Dizziness and fainting.  You get a rash or develop hives.  You have a decrease in urine output.  Your urine turns a dark color or changes to pink, red, or brown. Any of the following symptoms occur over the next 10 days:  You have a temperature by mouth above 102 F (38.9 C), not controlled by medicine.  Shortness of breath.  Weakness after normal activity.  The white part of the eye turns yellow (jaundice).  You have a decrease in the amount of urine or are urinating less often.  Your urine turns a dark color or changes to pink, red, or brown. Document Released: 01/28/2000 Document Revised: 04/24/2011 Document Reviewed: 09/16/2007 [facility] Patient Information 2014 [place], [place].  _______________________________________________________________________

## 2015-04-19 NOTE — Pre-Procedure Instructions (Addendum)
04-19-15 1605 [place]. Positive for Staph aureus. Note urinalysis results. 04-21-15 1640 Dr. Owens Sharkurk-reviewed EKG and compared to 3'16 with chart-"okay"

## 2015-04-23 NOTE — H&P (Signed)
TOTAL HIP ADMISSION H&P  Patient is admitted for left total hip arthroplasty, anterior approach.  Subjective:  Chief Complaint:     Left hip primary OA / pain  HPI: Kristy Watson, 70 y.o. female, has a history of pain and functional disability in the left hip(s) due to arthritis and patient has failed non-surgical conservative treatments for greater than 12 weeks to include NSAID's and/or analgesics and activity modification.  Onset of symptoms was gradual starting 6 months ago with rapidlly worsening course since that time.The patient noted prior procedures of the hip to include arthroplasty on the right hip on 09/26/2011 per Dr. Charlann Boxer.  Patient currently rates pain in the left hip at 9 out of 10 with activity. Patient has worsening of pain with activity and weight bearing, trendelenberg gait, pain that interfers with activities of daily living and pain with passive range of motion. Patient has evidence of periarticular osteophytes and joint space narrowing by imaging studies. This condition presents safety issues increasing the risk of falls.   There is no current active infection.   Risks, benefits and expectations were discussed with the patient.  Risks including but not limited to the risk of anesthesia, blood clots, nerve damage, blood vessel damage, failure of the prosthesis, infection and up to and including death.  Patient understand the risks, benefits and expectations and wishes to proceed with surgery.   PCP: Josue Hector, MD  D/C Plans:      Home with HHPT  Post-op Meds:       No Rx given  Tranexamic Acid:      To be given - IV  Decadron:      Is to be given  FYI:     ASA  Tramadol & APAP    Patient Active Problem List   Diagnosis Date Noted  . Expected blood loss, postoperative 09/28/2011  . 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 Hypertension   . Seasonal allergies   . GERD (gastroesophageal reflux disease)   . Hx-TIA (transient  ischemic attack)     3'16(memory loss-briefly)-no further issues  . Vertigo     occasional  . Hemorrhoids     history of  . Rash     recent "Torso-anterior/posterior-improved after antibiotic and hydrocortisone cream use"  . Eczema   . Arthritis     osteoarthritis/osteoporosis/osteopenia  . Pneumonia     3 weeks-"coughing increased,tepemture" -treated with antibiotic -Zpack,Inhaler    Past Surgical History  Procedure Laterality Date  . Abdominal hysterectomy  34 yrs ago  . Hemorroidectomy  yrs ago  . Tubal ligation  yrs ago  . Total hip arthroplasty  09/26/2011    Procedure: TOTAL HIP ARTHROPLASTY ANTERIOR APPROACH;  Surgeon: Shelda Pal, MD;  Location: [facility];  Service: Orthopedics;  Laterality: Right;    No prescriptions prior to admission   Allergies  Allergen Reactions  . Codeine Nausea And Vomiting  . Other Swelling    "Of lips"    Social History  Substance Use Topics  . Smoking status: Former Smoker -- 0.25 packs/day for 20 years    Types: Cigarettes    Quit date: 02/14/1999  . Smokeless tobacco: Never Used  . Alcohol Use: 1.8 oz/week    3 Glasses of wine per week     Comment: wine       Review of Systems  Constitutional: Negative.   HENT: Positive for hearing loss.   Eyes: Negative.   Respiratory:  Negative.   Cardiovascular: Negative.   Gastrointestinal: Positive for heartburn.  Genitourinary: Negative.   Musculoskeletal: Positive for joint pain and neck pain.  Skin: Negative.   Neurological: Negative.   Endo/Heme/Allergies: Positive for environmental allergies.  Psychiatric/Behavioral: Negative.     Objective:  Physical Exam  Constitutional: She is oriented to person, place, and time. She appears well-developed.  HENT:  Head: Normocephalic.  Eyes: Pupils are equal, round, and reactive to light.  Neck: Neck supple. No JVD present. No tracheal deviation present. No thyromegaly present.  Cardiovascular: Normal rate, regular rhythm, normal heart  sounds and intact distal pulses.   Respiratory: Effort normal and breath sounds normal. No stridor. No respiratory distress. She has no wheezes.  GI: Soft. There is no tenderness. There is no guarding.  Musculoskeletal:       Left hip: She exhibits decreased range of motion, decreased strength, tenderness and bony tenderness. She exhibits no swelling, no deformity and no laceration.  Lymphadenopathy:    She has no cervical adenopathy.  Neurological: She is alert and oriented to person, place, and time.  Skin: Skin is warm and dry.  Psychiatric: She has a normal mood and affect.       Labs:  Estimated body mass index is 20.81 kg/(m^2) as calculated from the following:   Height as of 09/26/11: 5\' 10"  (1.778 m).   Weight as of 09/20/11: 65.772 kg (145 lb).   Imaging Review Plain radiographs demonstrate severe degenerative joint disease of the left hip(s). The bone quality appears to be good for age and reported activity level.  Assessment/Plan:  End stage arthritis, left hip(s)  The patient history, physical examination, clinical judgement of the provider and imaging studies are consistent with end stage degenerative joint disease of the left hip(s) and total hip arthroplasty is deemed medically necessary. The treatment options including medical management, injection therapy, arthroscopy and arthroplasty were discussed at length. The risks and benefits of total hip arthroplasty were presented and reviewed. The risks due to aseptic loosening, infection, stiffness, dislocation/subluxation,  thromboembolic complications and other imponderables were discussed.  The patient acknowledged the explanation, agreed to proceed with the plan and consent was signed. Patient is being admitted for inpatient treatment for surgery, pain control, PT, OT, prophylactic antibiotics, VTE prophylaxis, progressive ambulation and ADL's and discharge planning.The patient is planning to be discharged home with home  health services.       Anastasio AuerbachMatthew S. Ryann Leavitt   PA-C  04/23/2015, 11:36 PM

## 2015-04-27 ENCOUNTER — Inpatient Hospital Stay (HOSPITAL_COMMUNITY): Payer: Medicare Other | Admitting: Registered Nurse

## 2015-04-27 ENCOUNTER — Inpatient Hospital Stay (HOSPITAL_COMMUNITY)
Admission: RE | Admit: 2015-04-27 | Discharge: 2015-04-28 | DRG: 470 | Disposition: A | Discharge status: Home Health | Payer: Medicare Other | Source: Ambulatory Visit | Attending: Orthopedic Surgery | Admitting: Orthopedic Surgery

## 2015-04-27 ENCOUNTER — Encounter (HOSPITAL_COMMUNITY): Payer: Self-pay

## 2015-04-27 ENCOUNTER — Encounter (HOSPITAL_COMMUNITY)
Admission: RE | Disposition: A | Discharge status: Home Health | Payer: Self-pay | Source: Ambulatory Visit | Attending: Orthopedic Surgery

## 2015-04-27 ENCOUNTER — Inpatient Hospital Stay (HOSPITAL_COMMUNITY): Payer: Medicare Other

## 2015-04-27 DIAGNOSIS — Z96649 Presence of unspecified artificial hip joint: Secondary | ICD-10-CM

## 2015-04-27 DIAGNOSIS — H9190 Unspecified hearing loss, unspecified ear: Secondary | ICD-10-CM | POA: Diagnosis present

## 2015-04-27 DIAGNOSIS — I10 Essential (primary) hypertension: Secondary | ICD-10-CM | POA: Diagnosis present

## 2015-04-27 DIAGNOSIS — K219 Gastro-esophageal reflux disease without esophagitis: Secondary | ICD-10-CM | POA: Diagnosis present

## 2015-04-27 DIAGNOSIS — Z8673 Personal history of transient ischemic attack (TIA), and cerebral infarction without residual deficits: Secondary | ICD-10-CM

## 2015-04-27 DIAGNOSIS — M1612 Unilateral primary osteoarthritis, left hip: Secondary | ICD-10-CM | POA: Diagnosis present

## 2015-04-27 DIAGNOSIS — Z01812 Encounter for preprocedural laboratory examination: Secondary | ICD-10-CM

## 2015-04-27 DIAGNOSIS — Z87891 Personal history of nicotine dependence: Secondary | ICD-10-CM | POA: Diagnosis not present

## 2015-04-27 DIAGNOSIS — M25552 Pain in left hip: Secondary | ICD-10-CM | POA: Diagnosis present

## 2015-04-27 DIAGNOSIS — Z96641 Presence of right artificial hip joint: Secondary | ICD-10-CM | POA: Diagnosis present

## 2015-04-27 HISTORY — PX: TOTAL HIP ARTHROPLASTY: SHX124

## 2015-04-27 LAB — TYPE AND SCREEN
ABO/RH(D): A POS
ANTIBODY SCREEN: NEGATIVE

## 2015-04-27 SURGERY — ARTHROPLASTY, HIP, TOTAL, ANTERIOR APPROACH
Anesthesia: Spinal | Site: Hip | Laterality: Left

## 2015-04-27 MED ORDER — ONDANSETRON HCL 4 MG/2ML IJ SOLN: 4.0000 mg | Freq: Four times a day (QID) | INTRAMUSCULAR | Status: DC | PRN

## 2015-04-27 MED ORDER — PHENYLEPHRINE 40 MCG/ML (10ML) SYRINGE FOR IV PUSH (FOR BLOOD PRESSURE SUPPORT)
PREFILLED_SYRINGE | INTRAVENOUS | Status: AC
  Filled 2015-04-27: qty 10

## 2015-04-27 MED ORDER — DIPHENHYDRAMINE HCL 25 MG PO CAPS: 25.0000 mg | ORAL_CAPSULE | Freq: Four times a day (QID) | ORAL | Status: DC | PRN

## 2015-04-27 MED ORDER — LACTATED RINGERS IV SOLN
INTRAVENOUS | Status: DC | PRN
  Administered 2015-04-27: 10:00:00 via INTRAVENOUS

## 2015-04-27 MED ORDER — METOCLOPRAMIDE HCL 10 MG PO TABS: 5.0000 mg | ORAL_TABLET | Freq: Three times a day (TID) | ORAL | Status: DC | PRN

## 2015-04-27 MED ORDER — CELECOXIB 200 MG PO CAPS
200.0000 mg | ORAL_CAPSULE | Freq: Two times a day (BID) | ORAL | Status: DC
  Administered 2015-04-27 – 2015-04-28 (×2): 200 mg via ORAL
  Filled 2015-04-27 (×3): qty 1

## 2015-04-27 MED ORDER — ONDANSETRON HCL 4 MG/2ML IJ SOLN
INTRAMUSCULAR | Status: DC | PRN
  Administered 2015-04-27: 4 mg via INTRAVENOUS

## 2015-04-27 MED ORDER — NEBIVOLOL HCL 5 MG PO TABS
5.0000 mg | ORAL_TABLET | Freq: Every day | ORAL | Status: DC
  Administered 2015-04-28: 5 mg via ORAL
  Filled 2015-04-27: qty 1

## 2015-04-27 MED ORDER — BISACODYL 10 MG RE SUPP: 10.0000 mg | Freq: Every day | RECTAL | Status: DC | PRN

## 2015-04-27 MED ORDER — ALUM & MAG HYDROXIDE-SIMETH 200-200-20 MG/5ML PO SUSP: 30.0000 mL | ORAL | Status: DC | PRN

## 2015-04-27 MED ORDER — TRANEXAMIC ACID 1000 MG/10ML IV SOLN
1000.0000 mg | Freq: Once | INTRAVENOUS | Status: AC
  Administered 2015-04-27: 1000 mg via INTRAVENOUS
  Filled 2015-04-27: qty 10

## 2015-04-27 MED ORDER — ASPIRIN EC 325 MG PO TBEC
325.0000 mg | DELAYED_RELEASE_TABLET | Freq: Two times a day (BID) | ORAL | Status: DC
  Administered 2015-04-28: 325 mg via ORAL
  Filled 2015-04-27 (×3): qty 1

## 2015-04-27 MED ORDER — DOCUSATE SODIUM 100 MG PO CAPS
100.0000 mg | ORAL_CAPSULE | Freq: Two times a day (BID) | ORAL | Status: DC
  Administered 2015-04-27 – 2015-04-28 (×2): 100 mg via ORAL

## 2015-04-27 MED ORDER — PHENOL 1.4 % MT LIQD
1.0000 | OROMUCOSAL | Status: DC | PRN
Start: 2015-04-27 — End: 2015-04-28

## 2015-04-27 MED ORDER — ACETAMINOPHEN 500 MG PO TABS
1000.0000 mg | ORAL_TABLET | Freq: Three times a day (TID) | ORAL | Status: DC
  Administered 2015-04-27 – 2015-04-28 (×3): 1000 mg via ORAL
  Filled 2015-04-27 (×6): qty 2

## 2015-04-27 MED ORDER — METHOCARBAMOL 500 MG PO TABS
500.0000 mg | ORAL_TABLET | Freq: Four times a day (QID) | ORAL | Status: DC | PRN
  Administered 2015-04-28 (×2): 500 mg via ORAL
  Filled 2015-04-27 (×3): qty 1

## 2015-04-27 MED ORDER — POLYETHYLENE GLYCOL 3350 17 G PO PACK
17.0000 g | PACK | Freq: Two times a day (BID) | ORAL | Status: DC
  Administered 2015-04-27 – 2015-04-28 (×2): 17 g via ORAL

## 2015-04-27 MED ORDER — METOCLOPRAMIDE HCL 5 MG/ML IJ SOLN: 5.0000 mg | Freq: Three times a day (TID) | INTRAMUSCULAR | Status: DC | PRN

## 2015-04-27 MED ORDER — LACTATED RINGERS IV SOLN: INTRAVENOUS | Status: DC

## 2015-04-27 MED ORDER — CEFAZOLIN SODIUM-DEXTROSE 2-3 GM-% IV SOLR
2.0000 g | INTRAVENOUS | Status: AC
  Administered 2015-04-27: 2 g via INTRAVENOUS

## 2015-04-27 MED ORDER — ONDANSETRON HCL 4 MG/2ML IJ SOLN
INTRAMUSCULAR | Status: AC
  Filled 2015-04-27: qty 2

## 2015-04-27 MED ORDER — SODIUM CHLORIDE 0.9 % IR SOLN
Status: DC | PRN
  Administered 2015-04-27: 1000 mL

## 2015-04-27 MED ORDER — LORAZEPAM 0.5 MG PO TABS: 0.5000 mg | ORAL_TABLET | Freq: Three times a day (TID) | ORAL | Status: DC | PRN

## 2015-04-27 MED ORDER — FERROUS SULFATE 325 (65 FE) MG PO TABS
325.0000 mg | ORAL_TABLET | Freq: Three times a day (TID) | ORAL | Status: DC
  Administered 2015-04-28: 325 mg via ORAL
  Filled 2015-04-27 (×4): qty 1

## 2015-04-27 MED ORDER — DEXAMETHASONE SODIUM PHOSPHATE 10 MG/ML IJ SOLN
INTRAMUSCULAR | Status: AC
  Filled 2015-04-27: qty 1

## 2015-04-27 MED ORDER — MEPERIDINE HCL 50 MG/ML IJ SOLN
INTRAMUSCULAR | Status: AC
  Filled 2015-04-27: qty 1

## 2015-04-27 MED ORDER — FAMOTIDINE 10 MG PO TABS
10.0000 mg | ORAL_TABLET | Freq: Two times a day (BID) | ORAL | Status: DC
  Administered 2015-04-27 – 2015-04-28 (×2): 10 mg via ORAL
  Filled 2015-04-27 (×3): qty 1

## 2015-04-27 MED ORDER — FENTANYL CITRATE (PF) 250 MCG/5ML IJ SOLN
INTRAMUSCULAR | Status: DC | PRN
  Administered 2015-04-27: 100 ug via INTRAVENOUS

## 2015-04-27 MED ORDER — AMLODIPINE BESYLATE 5 MG PO TABS
5.0000 mg | ORAL_TABLET | Freq: Every day | ORAL | Status: DC
  Administered 2015-04-28: 5 mg via ORAL
  Filled 2015-04-27: qty 1

## 2015-04-27 MED ORDER — ACETAMINOPHEN 650 MG RE SUPP
650.0000 mg | Freq: Three times a day (TID) | RECTAL | Status: DC
Start: 2015-04-27 — End: 2015-04-28
  Filled 2015-04-27 (×3): qty 1

## 2015-04-27 MED ORDER — BUPIVACAINE IN DEXTROSE 0.75-8.25 % IT SOLN
INTRATHECAL | Status: DC | PRN
  Administered 2015-04-27: 1.8 mL via INTRATHECAL

## 2015-04-27 MED ORDER — SODIUM CHLORIDE 0.9 % IV SOLN
100.0000 mL/h | INTRAVENOUS | Status: DC
  Administered 2015-04-27 – 2015-04-28 (×2): 100 mL/h via INTRAVENOUS
  Filled 2015-04-27 (×5): qty 1000

## 2015-04-27 MED ORDER — PROPOFOL 10 MG/ML IV BOLUS
INTRAVENOUS | Status: AC
  Filled 2015-04-27: qty 20

## 2015-04-27 MED ORDER — TRIAMCINOLONE ACETONIDE 0.1 % EX CREA
1.0000 "application " | TOPICAL_CREAM | Freq: Two times a day (BID) | CUTANEOUS | Status: DC
  Filled 2015-04-27: qty 15

## 2015-04-27 MED ORDER — MAGNESIUM CITRATE PO SOLN: 1.0000 | Freq: Once | ORAL | Status: DC | PRN

## 2015-04-27 MED ORDER — MOMETASONE FURO-FORMOTEROL FUM 100-5 MCG/ACT IN AERO
2.0000 | INHALATION_SPRAY | Freq: Two times a day (BID) | RESPIRATORY_TRACT | Status: DC
  Filled 2015-04-27: qty 8.8

## 2015-04-27 MED ORDER — METHOCARBAMOL 1000 MG/10ML IJ SOLN
500.0000 mg | Freq: Four times a day (QID) | INTRAMUSCULAR | Status: DC | PRN
  Filled 2015-04-27: qty 5

## 2015-04-27 MED ORDER — FENTANYL CITRATE (PF) 250 MCG/5ML IJ SOLN
INTRAMUSCULAR | Status: AC
Start: 2015-04-27 — End: 2015-04-27
  Filled 2015-04-27: qty 5

## 2015-04-27 MED ORDER — TRAMADOL HCL 50 MG PO TABS
50.0000 mg | ORAL_TABLET | Freq: Four times a day (QID) | ORAL | Status: DC
  Administered 2015-04-27 – 2015-04-28 (×3): 100 mg via ORAL
  Filled 2015-04-27 (×4): qty 2

## 2015-04-27 MED ORDER — ONDANSETRON HCL 4 MG PO TABS: 4.0000 mg | ORAL_TABLET | Freq: Four times a day (QID) | ORAL | Status: DC | PRN

## 2015-04-27 MED ORDER — CEFAZOLIN SODIUM-DEXTROSE 2-3 GM-% IV SOLR
2.0000 g | Freq: Four times a day (QID) | INTRAVENOUS | Status: AC
  Administered 2015-04-27 – 2015-04-28 (×2): 2 g via INTRAVENOUS
  Filled 2015-04-27 (×2): qty 50

## 2015-04-27 MED ORDER — MENTHOL 3 MG MT LOZG: 1.0000 | LOZENGE | OROMUCOSAL | Status: DC | PRN

## 2015-04-27 MED ORDER — PROMETHAZINE HCL 25 MG/ML IJ SOLN: 6.2500 mg | INTRAMUSCULAR | Status: DC | PRN

## 2015-04-27 MED ORDER — PROPOFOL 10 MG/ML IV BOLUS
INTRAVENOUS | Status: AC
  Filled 2015-04-27: qty 40

## 2015-04-27 MED ORDER — FENTANYL CITRATE (PF) 100 MCG/2ML IJ SOLN: 25.0000 ug | INTRAMUSCULAR | Status: DC | PRN

## 2015-04-27 MED ORDER — SODIUM CHLORIDE 0.9 % IV SOLN
INTRAVENOUS | Status: DC | PRN
  Administered 2015-04-27: 12:00:00 via INTRAVENOUS

## 2015-04-27 MED ORDER — PROPOFOL 500 MG/50ML IV EMUL
INTRAVENOUS | Status: DC | PRN
  Administered 2015-04-27: 100 ug/kg/min via INTRAVENOUS

## 2015-04-27 MED ORDER — DEXAMETHASONE SODIUM PHOSPHATE 10 MG/ML IJ SOLN
10.0000 mg | Freq: Once | INTRAMUSCULAR | Status: AC
  Administered 2015-04-28: 10 mg via INTRAVENOUS
  Filled 2015-04-27: qty 1

## 2015-04-27 MED ORDER — HYDROMORPHONE HCL 1 MG/ML IJ SOLN: 0.5000 mg | INTRAMUSCULAR | Status: DC | PRN

## 2015-04-27 MED ORDER — MEPERIDINE HCL 50 MG/ML IJ SOLN
6.2500 mg | INTRAMUSCULAR | Status: DC | PRN
  Administered 2015-04-27: 12.5 mg via INTRAVENOUS

## 2015-04-27 MED ORDER — LIDOCAINE HCL (CARDIAC) 20 MG/ML IV SOLN
INTRAVENOUS | Status: AC
  Filled 2015-04-27: qty 5

## 2015-04-27 MED ORDER — CHLORHEXIDINE GLUCONATE 4 % EX LIQD: 60.0000 mL | Freq: Once | CUTANEOUS | Status: DC

## 2015-04-27 MED ORDER — DEXAMETHASONE SODIUM PHOSPHATE 10 MG/ML IJ SOLN
10.0000 mg | Freq: Once | INTRAMUSCULAR | Status: AC
  Administered 2015-04-27: 10 mg via INTRAVENOUS

## 2015-04-27 MED ORDER — PROPOFOL 10 MG/ML IV BOLUS
INTRAVENOUS | Status: DC | PRN
  Administered 2015-04-27: 20 mg via INTRAVENOUS
  Administered 2015-04-27: 10 mg via INTRAVENOUS
  Administered 2015-04-27 (×2): 20 mg via INTRAVENOUS

## 2015-04-27 MED ORDER — PHENYLEPHRINE HCL 10 MG/ML IJ SOLN
INTRAMUSCULAR | Status: DC | PRN
  Administered 2015-04-27 (×5): 80 ug via INTRAVENOUS

## 2015-04-27 MED ORDER — CEFAZOLIN SODIUM-DEXTROSE 2-3 GM-% IV SOLR
INTRAVENOUS | Status: AC
  Filled 2015-04-27: qty 50

## 2015-04-27 MED ORDER — STERILE WATER FOR IRRIGATION IR SOLN
Status: DC | PRN
  Administered 2015-04-27: 1000 mL

## 2015-04-27 SURGICAL SUPPLY — 30 items
CAPT HIP TOTAL 2 ×1 IMPLANT
CLOTH BEACON ORANGE TIMEOUT ST (SAFETY) ×2 IMPLANT
COVER PERINEAL POST (MISCELLANEOUS) ×2 IMPLANT
DRAPE STERI IOBAN 125X83 (DRAPES) ×2 IMPLANT
DRAPE U-SHAPE 47X51 STRL (DRAPES) ×4 IMPLANT
DRSG AQUACEL AG ADV 3.5X10 (GAUZE/BANDAGES/DRESSINGS) ×2 IMPLANT
DURAPREP 26ML APPLICATOR (WOUND CARE) ×2 IMPLANT
ELECT REM PT RETURN 9FT ADLT (ELECTROSURGICAL) ×2
ELECTRODE REM PT RTRN 9FT ADLT (ELECTROSURGICAL) ×1 IMPLANT
GLOVE BIO SURGEON STRL SZ7.5 (GLOVE) ×1 IMPLANT
GLOVE BIOGEL PI IND STRL 7.5 (GLOVE) ×1 IMPLANT
GLOVE BIOGEL PI IND STRL 8.5 (GLOVE) ×1 IMPLANT
GLOVE BIOGEL PI INDICATOR 7.5 (GLOVE) ×5
GLOVE BIOGEL PI INDICATOR 8.5 (GLOVE) ×1
GLOVE ECLIPSE 8.0 STRL XLNG CF (GLOVE) ×4 IMPLANT
GLOVE ORTHO TXT STRL SZ7.5 (GLOVE) ×2 IMPLANT
GLOVE SURG SS PI 7.5 STRL IVOR (GLOVE) ×1 IMPLANT
GOWN STRL REUS W/TWL LRG LVL3 (GOWN DISPOSABLE) ×2 IMPLANT
GOWN STRL REUS W/TWL XL LVL3 (GOWN DISPOSABLE) ×4 IMPLANT
HOLDER FOLEY CATH W/STRAP (MISCELLANEOUS) ×2 IMPLANT
LIQUID BAND (GAUZE/BANDAGES/DRESSINGS) ×2 IMPLANT
PACK ANTERIOR HIP CUSTOM (KITS) ×2 IMPLANT
SAW OSC TIP CART 19.5X105X1.3 (SAW) ×2 IMPLANT
SUT MNCRL AB 4-0 PS2 18 (SUTURE) ×2 IMPLANT
SUT VIC AB 1 CT1 36 (SUTURE) ×6 IMPLANT
SUT VIC AB 2-0 CT1 27 (SUTURE) ×4
SUT VIC AB 2-0 CT1 TAPERPNT 27 (SUTURE) ×2 IMPLANT
SUT VLOC 180 0 24IN GS25 (SUTURE) ×2 IMPLANT
TRAY FOLEY W/METER SILVER 14FR (SET/KITS/TRAYS/PACK) ×1 IMPLANT
YANKAUER SUCT BULB TIP 10FT TU (MISCELLANEOUS) ×1 IMPLANT

## 2015-04-27 NOTE — Progress Notes (Signed)
Utilization review completed.  

## 2015-04-27 NOTE — Discharge Instructions (Signed)

## 2015-04-27 NOTE — Op Note (Signed)
NAME:  Kristy Watson                ACCOUNT NO.: [id number]      MEDICAL RECORD NO.: [id number]      FACILITY:  [facility]      PHYSICIAN:  Durene Romans D  DATE OF BIRTH:  [birth year]     DATE OF PROCEDURE:  04/27/2015                                 OPERATIVE REPORT         PREOPERATIVE DIAGNOSIS: Left  hip osteoarthritis.      POSTOPERATIVE DIAGNOSIS:  Left hip osteoarthritis.      PROCEDURE:  Left total hip replacement through an anterior approach   utilizing DePuy THR system, component size 52mm pinnacle cup, a size 36+4 neutral   Altrex liner, a size 4 standard Tri Lock stem with a 36+1.5 delta ceramic   ball.      SURGEON:  Kristy Frankel. Charlann Boxer, M.D.      ASSISTANT:  Kristy Gins, PA-C      ANESTHESIA:  Spinal.      SPECIMENS:  None.      COMPLICATIONS:  None.      BLOOD LOSS:  300 cc     DRAINS:  None      INDICATION OF THE PROCEDURE:  Kristy Watson is a 70 y.o. female who had   presented to office for evaluation of left hip pain.  Radiographs revealed   progressive degenerative changes with bone-on-bone   articulation to the  hip joint.  The patient had painful limited range of   motion significantly affecting their overall quality of life.  The patient was failing to    respond to conservative measures, and at this point was ready   to proceed with more definitive measures.  The patient has noted progressive   degenerative changes in his hip, progressive problems and dysfunction   with regarding the hip prior to surgery.  Consent was obtained for   benefit of pain relief.  Specific risk of infection, DVT, component   failure, dislocation, need for revision surgery, as well discussion of   the anterior versus posterior approach were reviewed.  Consent was   obtained for benefit of anterior pain relief through an anterior   approach.      PROCEDURE IN DETAIL:  The patient was brought to operative theater.   Once adequate anesthesia,  preoperative antibiotics, 2gm of Ancef, 1 gm of Tranexamic Acid, and 10 mg of Decadron administered.   The patient was positioned supine on the OSI Hanna table.  Once adequate   padding of boney process was carried out, we had predraped out the hip, and  used fluoroscopy to confirm orientation of the pelvis and position.      The left hip was then prepped and draped from proximal iliac crest to   mid thigh with shower curtain technique.      Time-out was performed identifying the patient, planned procedure, and   extremity.     An incision was then made 2 cm distal and lateral to the   anterior superior iliac spine extending over the orientation of the   tensor fascia lata muscle and sharp dissection was carried down to the   fascia of the muscle and protractor placed in the soft tissues.      The fascia was then  incised.  The muscle belly was identified and swept   laterally and retractor placed along the superior neck.  Following   cauterization of the circumflex vessels and removing some pericapsular   fat, a second cobra retractor was placed on the inferior neck.  A third   retractor was placed on the anterior acetabulum after elevating the   anterior rectus.  A L-capsulotomy was along the line of the   superior neck to the trochanteric fossa, then extended proximally and   distally.  Tag sutures were placed and the retractors were then placed   intracapsular.  We then identified the trochanteric fossa and   orientation of my neck cut, confirmed this radiographically   and then made a neck osteotomy with the femur on traction.  The femoral   head was removed without difficulty or complication.  Traction was let   off and retractors were placed posterior and anterior around the   acetabulum.      The labrum and foveal tissue were debrided.  I began reaming with a 47mm   reamer and reamed up to 51mm reamer with good bony bed preparation and a 52mm   cup was chosen.  The final 52mm  Pinnacle cup was then impacted under fluoroscopy  to confirm the depth of penetration and orientation with respect to   abduction.  A screw was placed followed by the hole eliminator.  The final   36+4 neutral Altrex liner was impacted with good visualized rim fit.  The cup was positioned anatomically within the acetabular portion of the pelvis.      At this point, the femur was rolled at 80 degrees.  Further capsule was   released off the inferior aspect of the femoral neck.  I then   released the superior capsule proximally.  The hook was placed laterally   along the femur and elevated manually and held in position with the bed   hook.  The leg was then extended and adducted with the leg rolled to 100   degrees of external rotation.  Once the proximal femur was fully   exposed, I used a box osteotome to set orientation.  I then began   broaching with the starting chili pepper broach and passed this by hand and then broached up to 4 (matching the other hip replacement).  With the 4 broach in place I chose a standard neck and did several trial reductions.  The offset was appropriate, leg lengths   appeared to be equal best matched with the +1.5 head ball, confirmed radiographically.   Given these findings, I went ahead and dislocated the hip, repositioned all   retractors and positioned the right hip in the extended and abducted position.  The final 4 Standard Tri Lock stem was   chosen and it was impacted down to the level of neck cut.  Based on this   and the trial reduction, a 36+1.5 delta ceramic ball was chosen and   impacted onto a clean and dry trunnion, and the hip was reduced.  The   hip had been irrigated throughout the case again at this point.  I did   reapproximate the superior capsular leaflet to the anterior leaflet   using #1 Vicryl.  The fascia of the   tensor fascia lata muscle was then reapproximated using #1 Vicryl and #0 V-lock sutures.  The   remaining wound was closed  with 2-0 Vicryl and running 4-0 Monocryl.   The hip was  cleaned, dried, and dressed sterilely using Dermabond and   Aquacel dressing.  She was then brought   to recovery room in stable condition tolerating the procedure well.    Kristy GinsMatthew Babish, PA-C was present for the entirety of the case involved from   preoperative positioning, perioperative retractor management, general   facilitation of the case, as well as primary wound closure as assistant.            Kristy Watson, M.D.        04/27/2015 12:24 PM

## 2015-04-27 NOTE — Anesthesia Postprocedure Evaluation (Signed)
Anesthesia Post Note  Patient: Jerrol BananaBonnie Slates  Procedure(s) Performed: Procedure(s) (LRB): LEFT TOTAL HIP ARTHROPLASTY ANTERIOR APPROACH (Left)  Patient location during evaluation: PACU Anesthesia Type: Spinal Level of consciousness: oriented and awake and alert Pain management: pain level controlled Vital Signs Assessment: post-procedure vital signs reviewed and stable Respiratory status: spontaneous breathing, respiratory function stable and patient connected to nasal cannula oxygen Cardiovascular status: blood pressure returned to baseline and stable Postop Assessment: no headache, no backache and spinal receding Anesthetic complications: no    Last Vitals:  Filed Vitals:   04/27/15 1345 04/27/15 1400  BP: 114/70 117/71  Pulse: 73 71  Temp:    Resp: 13 12    Last Pain: There were no vitals filed for this visit.  LLE Motor Response: Responds to commands (starting to move) (04/27/15 1400) LLE Sensation: Numbness;Tingling (due to spinal) (04/27/15 1400) RLE Motor Response:  (starting to move leg) (04/27/15 1400) RLE Sensation: Numbness;Tingling (due to spinal) (04/27/15 1400) L Sensory Level: S1-Sole of foot, small toes (04/27/15 1400) R Sensory Level: L2-Upper inner thigh, upper buttock (04/27/15 1400)  Shelton SilvasKevin D Zaeden Lastinger

## 2015-04-27 NOTE — Interval H&P Note (Signed)
History and Physical Interval Note:  04/27/2015 9:52 AM  Kristy Watson  has presented today for surgery, with the diagnosis of left hip osteoarthritis  The various methods of treatment have been discussed with the patient and family. After consideration of risks, benefits and other options for treatment, the patient has consented to  Procedure(s): LEFT TOTAL HIP ARTHROPLASTY ANTERIOR APPROACH (Left) as a surgical intervention .  The patient's history has been reviewed, patient examined, no change in status, stable for surgery.  I have reviewed the patient's chart and labs.  Questions were answered to the patient's satisfaction.     Shelda PalLIN,Haruki Arnold D

## 2015-04-27 NOTE — Anesthesia Procedure Notes (Signed)
Spinal Patient location during procedure: OR Start time: 04/27/2015 11:10 AM End time: 04/27/2015 11:13 AM Staffing Anesthesiologist: Suella Broad D Preanesthetic Checklist Completed: patient identified, site marked, surgical consent, pre-op evaluation, timeout performed, IV checked, risks and benefits discussed and monitors and equipment checked Spinal Block Patient position: sitting Prep: Betadine Patient monitoring: heart rate, continuous pulse ox, blood pressure and cardiac monitor Approach: midline Location: L4-5 Injection technique: single-shot Needle Needle type: Whitacre and Introducer  Needle gauge: 24 G Needle length: 9 cm Additional Notes Negative paresthesia. Negative blood return. Positive free-flowing CSF. Expiration date of kit checked and confirmed. Patient tolerated procedure well, without complications.

## 2015-04-27 NOTE — Transfer of Care (Signed)
Immediate Anesthesia Transfer of Care Note  Patient: Kristy Watson  Procedure(s) Performed: Procedure(s): LEFT TOTAL HIP ARTHROPLASTY ANTERIOR APPROACH (Left)  Patient Location: PACU  Anesthesia Type:MAC and Spinal  Level of Consciousness: awake, alert , oriented and patient cooperative  Airway & Oxygen Therapy: Patient Spontanous Breathing and Patient connected to face mask oxygen  Post-op Assessment: Report given to RN and Post -op Vital signs reviewed and stable  Post vital signs: Reviewed and stable  Last Vitals: There were no vitals filed for this visit.  Complications: No apparent anesthesia complications

## 2015-04-27 NOTE — Anesthesia Preprocedure Evaluation (Addendum)
Anesthesia Evaluation  Patient identified by MRN, date of birth, ID band Patient awake    Reviewed: Allergy & Precautions, NPO status , Patient's Chart, lab work & pertinent test results  Airway Mallampati: I  TM Distance: >3 FB Neck ROM: Full    Dental  (+) Teeth Intact   Pulmonary neg shortness of breath, neg sleep apnea, neg COPD, neg recent URI, former smoker,    breath sounds clear to auscultation       Cardiovascular hypertension, Pt. on medications and Pt. on home beta blockers  Rhythm:Regular     Neuro/Psych negative neurological ROS  negative psych ROS   GI/Hepatic Neg liver ROS, GERD  Medicated,  Endo/Other  negative endocrine ROS  Renal/GU negative Renal ROS  negative genitourinary   Musculoskeletal  (+) Arthritis , Osteoarthritis,    Abdominal   Peds negative pediatric ROS (+)  Hematology negative hematology ROS (+)   Anesthesia Other Findings - Chronic Hyponatremia (126 - 2017, 130 - 2013)  Reproductive/Obstetrics negative OB ROS                          Lab Results  Component Value Date   CREATININE 0.94 04/19/2015   BUN 14 04/19/2015   NA 126* 04/19/2015   K 4.8 04/19/2015   CL 92* 04/19/2015   CO2 27 04/19/2015   Lab Results  Component Value Date   WBC 6.5 04/19/2015   HGB 12.7 04/19/2015   HCT 38.9 04/19/2015   MCV 96.0 04/19/2015   PLT 246 04/19/2015   Lab Results  Component Value Date   INR 0.91 04/19/2015   INR 0.94 09/20/2011   04/2015 EKG: normal sinus rhythm.   Anesthesia Physical Anesthesia Plan  ASA: III  Anesthesia Plan: Spinal   Post-op Pain Management:    Induction: Intravenous  Airway Management Planned: Natural Airway and Simple Face Mask  Additional Equipment:   Intra-op Plan:   Post-operative Plan:   Informed Consent: I have reviewed the patients History and Physical, chart, labs and discussed the procedure including the risks,  benefits and alternatives for the proposed anesthesia with the patient or authorized representative who has indicated his/her understanding and acceptance.   Dental advisory given  Plan Discussed with: CRNA  Anesthesia Plan Comments: (Discussed hyponatremia with patient. PCP is aware and no further work up has been provided. She denies any associated sequelae. I instructed Ms. Kizzie BaneHughes to have her PCP re-address the hyponatremia after her surgery. )       Anesthesia Quick Evaluation

## 2015-04-28 LAB — CBC
HEMATOCRIT: 27.1 % — AB (ref 36.0–46.0)
HEMOGLOBIN: 9.3 g/dL — AB (ref 12.0–15.0)
MCH: 31.8 pg (ref 26.0–34.0)
MCHC: 34.3 g/dL (ref 30.0–36.0)
MCV: 92.8 fL (ref 78.0–100.0)
Platelets: 221 10*3/uL (ref 150–400)
RBC: 2.92 MIL/uL — ABNORMAL LOW (ref 3.87–5.11)
RDW: 13.6 % (ref 11.5–15.5)
WBC: 10.5 10*3/uL (ref 4.0–10.5)

## 2015-04-28 LAB — BASIC METABOLIC PANEL
ANION GAP: 6 (ref 5–15)
BUN: 16 mg/dL (ref 6–20)
CHLORIDE: 105 mmol/L (ref 101–111)
CO2: 24 mmol/L (ref 22–32)
Calcium: 8.6 mg/dL — ABNORMAL LOW (ref 8.9–10.3)
Creatinine, Ser: 0.79 mg/dL (ref 0.44–1.00)
GFR calc non Af Amer: >60 mL/min (ref >60)
GLUCOSE: 125 mg/dL — AB (ref 65–99)
Potassium: 4.5 mmol/L (ref 3.5–5.1)
Sodium: 135 mmol/L (ref 135–145)

## 2015-04-28 MED ORDER — ASPIRIN 325 MG PO TBEC: 325.0000 mg | DELAYED_RELEASE_TABLET | Freq: Two times a day (BID) | ORAL | Status: AC

## 2015-04-28 MED ORDER — TIZANIDINE HCL 4 MG PO TABS: 4.0000 mg | ORAL_TABLET | Freq: Four times a day (QID) | ORAL | Status: AC | PRN

## 2015-04-28 MED ORDER — TRAMADOL HCL 50 MG PO TABS: 50.0000 mg | ORAL_TABLET | Freq: Four times a day (QID) | ORAL | Status: AC | PRN

## 2015-04-28 MED ORDER — FERROUS SULFATE 325 (65 FE) MG PO TABS: 325.0000 mg | ORAL_TABLET | Freq: Three times a day (TID) | ORAL | Status: AC

## 2015-04-28 MED ORDER — ACETAMINOPHEN 500 MG PO TABS: 1000.0000 mg | ORAL_TABLET | Freq: Three times a day (TID) | ORAL | Status: AC

## 2015-04-28 MED ORDER — POLYETHYLENE GLYCOL 3350 17 G PO PACK: 17.0000 g | PACK | Freq: Two times a day (BID) | ORAL | Status: AC

## 2015-04-28 MED ORDER — DOCUSATE SODIUM 100 MG PO CAPS: 100.0000 mg | ORAL_CAPSULE | Freq: Two times a day (BID) | ORAL | Status: AC

## 2015-04-28 NOTE — Progress Notes (Signed)
     Subjective: 1 Day Post-Op Procedure(s) (LRB): LEFT TOTAL HIP ARTHROPLASTY ANTERIOR APPROACH (Left)   Patient reports pain as mild, pain well controlled. No events throughout the night.  Feels really good this AM.  Ready to be discharged home.   Objective:   VITALS:   Filed Vitals:   04/28/15 0200 04/28/15 0517  BP: 118/74 125/67  Pulse: 81 75  Temp: 98 F (36.7 C) 98.1 F (36.7 C)  Resp: 14 15    Dorsiflexion/Plantar flexion intact Incision: dressing C/D/I No cellulitis present Compartment soft  LABS  Recent Labs  04/28/15 0427  HGB 9.3*  HCT 27.1*  WBC 10.5  PLT 221     Recent Labs  04/28/15 0427  NA 135  K 4.5  BUN 16  CREATININE 0.79  GLUCOSE 125*     Assessment/Plan: 1 Day Post-Op Procedure(s) (LRB): LEFT TOTAL HIP ARTHROPLASTY ANTERIOR APPROACH (Left) Foley cath d/c'ed Advance diet Up with therapy D/C IV fluids Discharge home with home health  [facility]. Follow up with OLIN,Jhoana Upham D in 2 weeks.  Contact information:  [facility] [address] [place] [id number] [phone]           Kristy AuerbachMatthew S. Marne Watson   PAC  04/28/2015, 9:21 AM

## 2015-04-28 NOTE — Care Management Note (Signed)
Case Management Note  Patient Details  Name: Andalyn Heckstall MRN: [id number] Date of Birth: [birth year]  Subjective/Objective:                  LEFT TOTAL HIP ARTHROPLASTY ANTERIOR APPROACH (Left)  Action/Plan: Discharge planning Expected Discharge Date:  04/28/15               Expected Discharge Plan:  [facility]  In-House Referral:     Discharge planning Services  CM Consult  Post Acute Care Choice:    Choice offered to:  Patient  DME Arranged:  N/A DME Agency:  NA  HH Arranged:  PT HH Agency:  [facility]  Status of Service:  Completed, signed off  Medicare Important Message Given:    Date Medicare IM Given:    Medicare IM give by:    Date Additional Medicare IM Given:    Additional Medicare Important Message give by:     If discussed at [place] of Stay Meetings, dates discussed:    Additional Comments: CM met with pt in room to offer choice of home health agency.  Pt chooses [facility] to render HHPT.  Pt has rolling walker elevated and handle asst for toilet, and walk in shower seat.  Referral given to [employer], Tim.  No other CM needs were communicated. Dellie Catholic, RN 04/28/2015, 12:07 PM

## 2015-04-28 NOTE — Evaluation (Signed)
Occupational Therapy Evaluation Patient Details Name: Kristy BananaBonnie Haverstock MRN: [id number] DOB: 07/29/1945 Today's Date: 04/28/2015    History of Present Illness s/p L DA THA, h/o R in '13   Clinical Impression   This 70 year old female was admitted for the above surgery. All education was completed.  Pt does not need any further OT at this time    Follow Up Recommendations  No OT follow up    Equipment Recommendations  None recommended by OT    Recommendations for Other Services       Precautions / Restrictions Precautions Precautions: Fall Restrictions Weight Bearing Restrictions: No Other Position/Activity Restrictions: WBAT      Mobility Bed Mobility Overal bed mobility: Modified Independent             General bed mobility comments: HOB raised, supine to sit  Transfers Overall transfer level: Needs assistance Equipment used: Rolling walker (2 wheeled) Transfers: Sit to/from Stand Sit to Stand: Min guard         General transfer comment: cues for UE/LE placement    Balance                                            ADL Overall ADL's : Needs assistance/impaired             Lower Body Bathing: Minimal assistance;Sit to/from stand       Lower Body Dressing: Moderate assistance;Sit to/from stand   Toilet Transfer: Ambulation;Comfort height toilet;Grab bars;Min guard   Toileting- Clothing Manipulation and Hygiene: Min guard;Sit to/from stand   Tub/ Shower Transfer: Walk-in shower;Min guard;Ambulation     General ADL Comments: husband will assist as needed.  Pt does have a reacher. She has a comfort height commode with a sink next to it. Practiced with grab bar and also reaching back for toilet seat.       Vision     Perception     Praxis      Pertinent Vitals/Pain Pain Assessment: 0-10 Pain Score: 4  Pain Location: L hip Pain Descriptors / Indicators: Burning;Sore Pain Intervention(s): Limited activity within  patient's tolerance;Monitored during session;Premedicated before session;Repositioned;Ice applied     Hand Dominance     Extremity/Trunk Assessment Upper Extremity Assessment Upper Extremity Assessment: Overall WFL for tasks assessed           Communication Communication Communication: No difficulties   Cognition Arousal/Alertness: Awake/alert Behavior During Therapy: WFL for tasks assessed/performed Overall Cognitive Status: Within Functional Limits for tasks assessed                     General Comments       Exercises       Shoulder Instructions      Home Living Family/patient expects to be discharged to:: Private residence Living Arrangements: Spouse/significant other Available Help at Discharge: Family Type of Home: House Home Access: Stairs to enter [employer] CorporationEntrance Stairs-Number of Steps: 1   Home Layout: One level     Bathroom Shower/Tub: Tub/shower unit;Walk-in shower   Bathroom Toilet: Handicapped height     Home Equipment: Shower seat - built in          Prior Functioning/Environment Level of Independence: Needs assistance (help for socks)             OT Diagnosis: Acute pain   OT Problem List:     OT  Treatment/Interventions:      OT Goals(Current goals can be found in the care plan section) Acute Rehab OT Goals Patient Stated Goal: get back to being independent OT Goal Formulation: All assessment and education complete, DC therapy  OT Frequency:     Barriers to D/C:            Co-evaluation              End of Session    Activity Tolerance: Patient tolerated treatment well Patient left: in chair;with call bell/phone within reach;with chair alarm set;with family/visitor present   Time: 4098-1191 OT Time Calculation (min): 23 min Charges:  OT General Charges $OT Visit: 1 Procedure OT Evaluation $OT Eval Low Complexity: 1 Procedure G-Codes:    Sumaiya Arruda 05/13/15, 9:54 AM  Marica Otter,  OTR/L [phone] May 13, 2015

## 2015-04-28 NOTE — Evaluation (Signed)
Physical Therapy Evaluation Patient Details Name: Nakya Weyand MRN: [id number] DOB: 09/07/1945 Today's Date: 04/28/2015   History of Present Illness  s/p L DA THA, h/o R in '13  Clinical Impression  Patient is mobilizing well. Has practiced 1 step and exercises. Ready for DC home.    Follow Up Recommendations Home health PT;Supervision - Intermittent    Equipment Recommendations  None recommended by PT    Recommendations for Other Services       Precautions / Restrictions Precautions Precautions: Fall Restrictions Weight Bearing Restrictions: No Other Position/Activity Restrictions: WBAT      Mobility  Bed Mobility Overal bed mobility: Modified Independent             General bed mobility comments: HOB raised, supine to sit  Transfers Overall transfer level: Needs assistance Equipment used: Rolling walker (2 wheeled) Transfers: Sit to/from Stand Sit to Stand: Supervision         General transfer comment: cues for UE/LE placement  Ambulation/Gait Ambulation/Gait assistance: Min guard Ambulation Distance (Feet): 150 Feet Assistive device: Rolling walker (2 wheeled) Gait Pattern/deviations: Step-through pattern     General Gait Details: cues for turning.  Stairs 1 step forward with RW. Min guard.            Wheelchair Mobility    Modified Rankin (Stroke Patients Only)       Balance                                             Pertinent Vitals/Pain Pain Assessment: 0-10 Pain Score: 1  Pain Location: L thigh Pain Descriptors / Indicators: Tightness;Sore Pain Intervention(s): Premedicated before session;Ice applied    Home Living Family/patient expects to be discharged to:: Private residence Living Arrangements: Spouse/significant other Available Help at Discharge: Family Type of Home: House Home Access: Stairs to enter   Technical brewer of Steps: 1 Home Layout: One level Home Equipment: Shower seat - built  in      Prior Function Level of Independence: Needs assistance (help for socks)               Hand Dominance        Extremity/Trunk Assessment   Upper Extremity Assessment: Defer to OT evaluation           Lower Extremity Assessment: LLE deficits/detail   LLE Deficits / Details: active hip flexion in supine     Communication   Communication: No difficulties  Cognition Arousal/Alertness: Awake/alert Behavior During Therapy: WFL for tasks assessed/performed Overall Cognitive Status: Within Functional Limits for tasks assessed                      General Comments      Exercises Total Joint Exercises Ankle Circles/Pumps: AROM;Both;10 reps;Supine Quad Sets: AROM;Both;10 reps Short Arc Quad: AROM;Left;10 reps;Supine Heel Slides: AROM;Left;10 reps;Supine Hip ABduction/ADduction: AROM;Left;10 reps;Supine Long Arc Quad: AROM;Left;Seated      Assessment/Plan    PT Assessment Patent does not need any further PT services;All further PT needs can be met in the next venue of care  PT Diagnosis     PT Problem List    PT Treatment Interventions     PT Goals (Current goals can be found in the Care Plan section) Acute Rehab PT Goals Patient Stated Goal: get back to being independent PT Goal Formulation: All assessment and education complete, DC  therapy    Frequency     Barriers to discharge        Co-evaluation               End of Session   Activity Tolerance: Patient tolerated treatment well Patient left: in chair;with call bell/phone within reach Nurse Communication: Mobility status         Time: 1055-1130 PT Time Calculation (min) (ACUTE ONLY): 35 min   Charges:   PT Evaluation $PT Eval Low Complexity: 1 Procedure PT Treatments $Gait Training: 8-22 mins   PT G Codes:        Claretha Cooper 04/28/2015, 11:44 AM Tresa Endo PT [phone]

## 2015-05-03 NOTE — Discharge Summary (Signed)
Physician Discharge Summary  Patient ID: Kristy Watson MRN: [id number] DOB/AGE: 70-Oct-1947 32 y.o.  Admit date: 04/27/2015 Discharge date: 04/28/2015   Procedures:  Procedure(s) (LRB): LEFT TOTAL HIP ARTHROPLASTY ANTERIOR APPROACH (Left)  Attending Physician:  Dr. Durene Romans   Admission Diagnoses:   Left hip primary OA / pain  Discharge Diagnoses:  Principal Problem:   S/P left THA, AA  Past Medical History  Diagnosis Date  . Hypertension   . Seasonal allergies   . GERD (gastroesophageal reflux disease)   . Hx-TIA (transient ischemic attack)     3'16(memory loss-briefly)-no further issues  . Vertigo     occasional  . Hemorrhoids     history of  . Rash     recent "Torso-anterior/posterior-improved after antibiotic and hydrocortisone cream use"  . Eczema   . Arthritis     osteoarthritis/osteoporosis/osteopenia  . Pneumonia     3 weeks-"coughing increased,tepemture" -treated with antibiotic -Zpack,Inhaler    HPI:    Kristy Watson, 70 y.o. female, has a history of pain and functional disability in the left hip(s) due to arthritis and patient has failed non-surgical conservative treatments for greater than 12 weeks to include NSAID's and/or analgesics and activity modification. Onset of symptoms was gradual starting 6 months ago with rapidlly worsening course since that time.The patient noted prior procedures of the hip to include arthroplasty on the right hip on 09/26/2011 per Dr. Charlann Boxer. Patient currently rates pain in the left hip at 9 out of 10 with activity. Patient has worsening of pain with activity and weight bearing, trendelenberg gait, pain that interfers with activities of daily living and pain with passive range of motion. Patient has evidence of periarticular osteophytes and joint space narrowing by imaging studies. This condition presents safety issues increasing the risk of falls. There is no current active infection. Risks, benefits and expectations were  discussed with the patient. Risks including but not limited to the risk of anesthesia, blood clots, nerve damage, blood vessel damage, failure of the prosthesis, infection and up to and including death. Patient understand the risks, benefits and expectations and wishes to proceed with surgery.   PCP: Josue Hector, MD   Discharged Condition: good  Hospital Course:  Patient underwent the above stated procedure on 04/27/2015. Patient tolerated the procedure well and brought to the recovery room in good condition and subsequently to the floor.  POD #1 BP: 125/67 ; Pulse: 75 ; Temp: 98.1 F (36.7 C) ; Resp: 15 Patient reports pain as mild, pain well controlled. No events throughout the night. Feels really good this AM. Ready to be discharged home.  Dorsiflexion/plantar flexion intact, incision: dressing C/D/I, no cellulitis present and compartment soft.   LABS  Basename    HGB     9.3  HCT     27.1    Discharge Exam: General appearance: alert, cooperative and no distress Extremities: Homans sign is negative, no sign of DVT, no edema, redness or tenderness in the calves or thighs and no ulcers, gangrene or trophic changes  Disposition: Home with follow up in 2 weeks   Follow-up Information    Follow up with Shelda Pal, MD. Schedule an appointment as soon as possible for a visit in 2 weeks.   Specialty:  Orthopedic Surgery   Contact information:   [address] [place] [address] [phone]       Follow up with [facility].   Why:  home health physical therapy   Contact information:  [address] [place] [id number] [phone]       Discharge Instructions    Call MD / Call 911    Complete by:  As directed   If you experience chest pain or shortness of breath, CALL 911 and be transported to the hospital emergency room.  If you develope a fever above 101 F, pus (white drainage) or increased drainage or redness at  the wound, or calf pain, call your surgeon's office.     Change dressing    Complete by:  As directed   Maintain surgical dressing until follow up in the clinic. If the edges start to pull up, may reinforce with tape. If the dressing is no longer working, may remove and cover with gauze and tape, but must keep the area dry and clean.  Call with any questions or concerns.     Constipation Prevention    Complete by:  As directed   Drink plenty of fluids.  Prune juice may be helpful.  You may use a stool softener, such as Colace (over the counter) 100 mg twice a day.  Use MiraLax (over the counter) for constipation as needed.     Diet - low sodium heart healthy    Complete by:  As directed      Discharge instructions    Complete by:  As directed   Maintain surgical dressing until follow up in the clinic. If the edges start to pull up, may reinforce with tape. If the dressing is no longer working, may remove and cover with gauze and tape, but must keep the area dry and clean.  [facility]. Call with any questions or concerns.     Increase activity slowly as tolerated    Complete by:  As directed   Weight bearing as tolerated with assist device (walker, cane, etc) as directed, use it as long as suggested by your surgeon or therapist, typically at least 4-6 weeks.     TED hose    Complete by:  As directed   Use stockings (TED hose) for 2 weeks on both leg(s).  You may remove them at night for sleeping.             Medication List    TAKE these medications        acetaminophen 500 MG tablet  Commonly known as:  TYLENOL  Take 2 tablets (1,000 mg total) by mouth every 8 (eight) hours.     amLODipine 5 MG tablet  Commonly known as:  NORVASC  Take 5 mg by mouth daily.     aspirin 325 MG EC tablet  Take 1 tablet (325 mg total) by mouth 2 (two) times daily.     BYSTOLIC 5 MG tablet  Generic drug:  nebivolol  Take 5 mg by mouth daily.     celecoxib 200  MG capsule  Commonly known as:  CELEBREX  Take 200 mg by mouth daily.     cholecalciferol 1000 units tablet  Commonly known as:  VITAMIN D  Take 2,000 Units by mouth daily.     diphenhydrAMINE 25 mg capsule  Commonly known as:  BENADRYL  Take 1 capsule (25 mg total) by mouth every 6 (six) hours as needed for itching, allergies or sleep.     docusate sodium 100 MG capsule  Commonly known as:  COLACE  Take 1 capsule (100 mg total) by mouth 2 (two) times daily.  ferrous sulfate 325 (65 FE) MG tablet  Take 1 tablet (325 mg total) by mouth 3 (three) times daily after meals.     Fluticasone-Salmeterol 100-50 MCG/DOSE Aepb  Commonly known as:  ADVAIR  Inhale 1 puff into the lungs 2 (two) times daily as needed. Wheezing     LORazepam 0.5 MG tablet  Commonly known as:  ATIVAN  Take 0.5 mg by mouth every 8 (eight) hours as needed. Anxiety     minocycline 50 MG capsule  Commonly known as:  MINOCIN,DYNACIN  Take 50 mg by mouth 2 (two) times daily.     Omega 3 1200 MG Caps  Take 1 capsule by mouth 3 (three) times daily.     polyethylene glycol packet  Commonly known as:  MIRALAX / GLYCOLAX  Take 17 g by mouth 2 (two) times daily.     ranitidine 75 MG tablet  Commonly known as:  ZANTAC  Take 75 mg by mouth 2 (two) times daily.     tiZANidine 4 MG tablet  Commonly known as:  ZANAFLEX  Take 1 tablet (4 mg total) by mouth every 6 (six) hours as needed for muscle spasms.     traMADol 50 MG tablet  Commonly known as:  ULTRAM  Take 1-2 tablets (50-100 mg total) by mouth every 6 (six) hours as needed.     triamcinolone cream 0.1 %  Commonly known as:  KENALOG  Apply 1 application topically 2 (two) times daily.         Signed: Anastasio Auerbach. Lily Kernen   PA-C  05/03/2015, 9:36 PM

## 2017-06-06 IMAGING — DX DG HIP (WITH OR WITHOUT PELVIS) 1V PORT*L*
2 series · 2 of 2 positions shown · non-contrast
Comparison: Portable exam 6891 hours without priors for comparison.

CLINICAL DATA: Post LEFT hip replacement

EXAM:
DG HIP (WITH OR WITHOUT PELVIS) 1V PORT LEFT

[hip x-table]
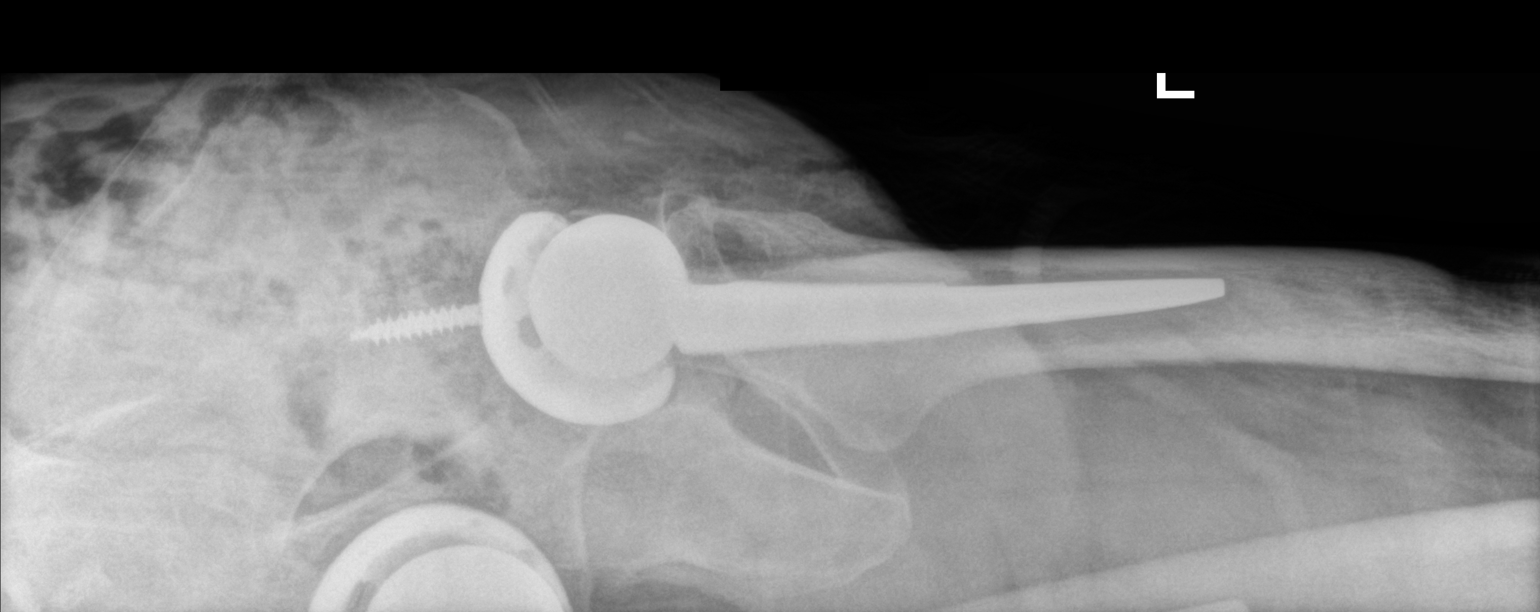

[pelvis ap]
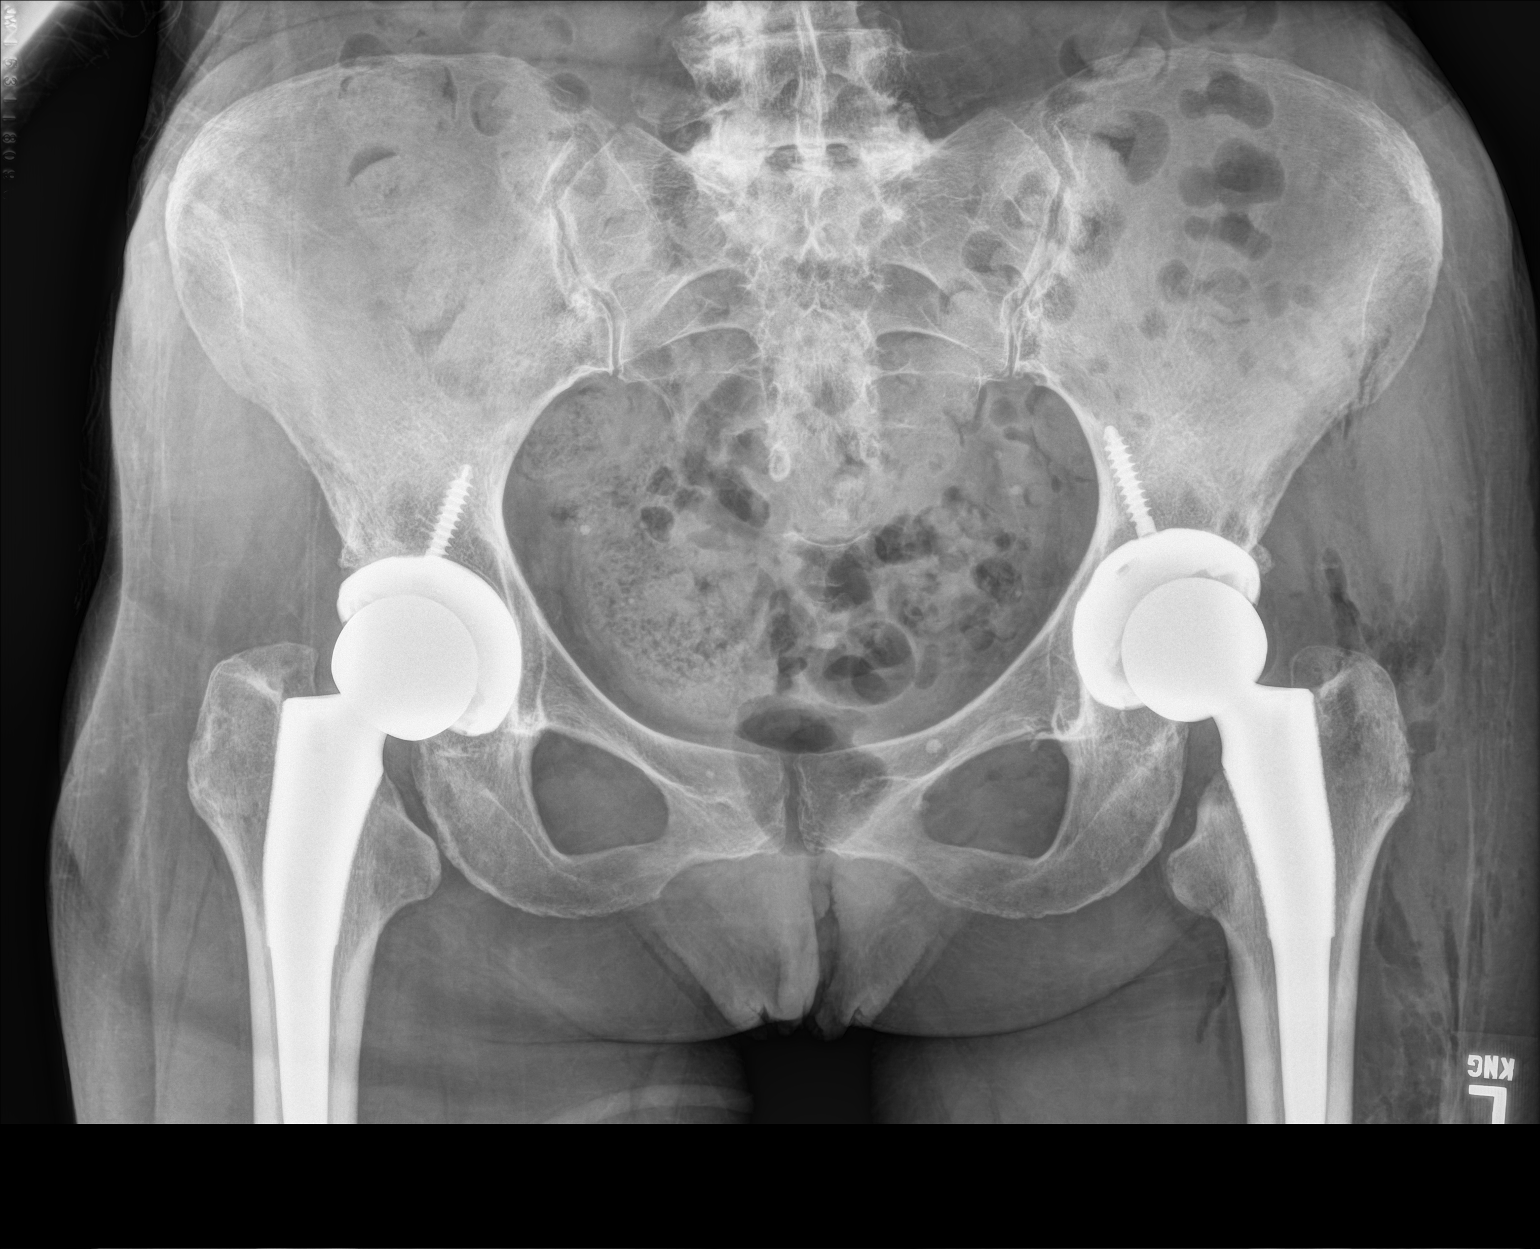

[2 of 2 positions shown; findings below may reference images not displayed]

FINDINGS: BILATERAL hip prostheses identified.

Soft tissue gas at LEFT hip region compatible with preceding
surgery.

Bones demineralized.

No fracture or dislocation.
IMPRESSION: BILATERAL hip prostheses.

No acute proximal LEFT femoral abnormalities.

## 2021-08-13 DEATH — deceased
